# Patient Record
Sex: Male | Born: 1960 | Race: White | Marital: Single | Smoking: Current every day smoker
Health system: Southern US, Community
[De-identification: ages and names within clinical notes are randomized; demographics above are authoritative.]

## PROBLEM LIST (undated history)

## (undated) DIAGNOSIS — I219 Acute myocardial infarction, unspecified: Secondary | ICD-10-CM

## (undated) DIAGNOSIS — J42 Unspecified chronic bronchitis: Secondary | ICD-10-CM

## (undated) DIAGNOSIS — J189 Pneumonia, unspecified organism: Secondary | ICD-10-CM

## (undated) DIAGNOSIS — Z9981 Dependence on supplemental oxygen: Secondary | ICD-10-CM

## (undated) DIAGNOSIS — M109 Gout, unspecified: Secondary | ICD-10-CM

## (undated) DIAGNOSIS — B977 Papillomavirus as the cause of diseases classified elsewhere: Secondary | ICD-10-CM

## (undated) DIAGNOSIS — B1910 Unspecified viral hepatitis B without hepatic coma: Secondary | ICD-10-CM

## (undated) DIAGNOSIS — K219 Gastro-esophageal reflux disease without esophagitis: Secondary | ICD-10-CM

## (undated) DIAGNOSIS — M1990 Unspecified osteoarthritis, unspecified site: Secondary | ICD-10-CM

## (undated) DIAGNOSIS — H9190 Unspecified hearing loss, unspecified ear: Secondary | ICD-10-CM

## (undated) DIAGNOSIS — J45909 Unspecified asthma, uncomplicated: Secondary | ICD-10-CM

## (undated) DIAGNOSIS — B20 Human immunodeficiency virus [HIV] disease: Secondary | ICD-10-CM

## (undated) DIAGNOSIS — J449 Chronic obstructive pulmonary disease, unspecified: Secondary | ICD-10-CM

## (undated) DIAGNOSIS — K259 Gastric ulcer, unspecified as acute or chronic, without hemorrhage or perforation: Secondary | ICD-10-CM

## (undated) HISTORY — PX: EXCISIONAL HEMORRHOIDECTOMY: SHX1541

## (undated) HISTORY — PX: KNEE SURGERY: SHX244

---

## 2013-12-04 ENCOUNTER — Emergency Department (HOSPITAL_COMMUNITY): Payer: Medicare (Managed Care)

## 2013-12-04 ENCOUNTER — Encounter (HOSPITAL_COMMUNITY): Payer: Self-pay | Admitting: Emergency Medicine

## 2013-12-04 ENCOUNTER — Observation Stay (HOSPITAL_COMMUNITY)
Admission: EM | Admit: 2013-12-04 | Discharge: 2013-12-06 | Disposition: A | Discharge status: Home / Self Care | Payer: Medicare (Managed Care) | Attending: Family Medicine | Admitting: Family Medicine

## 2013-12-04 DIAGNOSIS — IMO0001 Reserved for inherently not codable concepts without codable children: Secondary | ICD-10-CM

## 2013-12-04 DIAGNOSIS — F17200 Nicotine dependence, unspecified, uncomplicated: Secondary | ICD-10-CM | POA: Insufficient documentation

## 2013-12-04 DIAGNOSIS — J441 Chronic obstructive pulmonary disease with (acute) exacerbation: Secondary | ICD-10-CM | POA: Insufficient documentation

## 2013-12-04 DIAGNOSIS — R0789 Other chest pain: Principal | ICD-10-CM

## 2013-12-04 DIAGNOSIS — I350 Nonrheumatic aortic (valve) stenosis: Secondary | ICD-10-CM

## 2013-12-04 DIAGNOSIS — Z5900 Homelessness unspecified: Secondary | ICD-10-CM

## 2013-12-04 DIAGNOSIS — R079 Chest pain, unspecified: Secondary | ICD-10-CM | POA: Insufficient documentation

## 2013-12-04 DIAGNOSIS — B977 Papillomavirus as the cause of diseases classified elsewhere: Secondary | ICD-10-CM

## 2013-12-04 DIAGNOSIS — Z9981 Dependence on supplemental oxygen: Secondary | ICD-10-CM | POA: Insufficient documentation

## 2013-12-04 DIAGNOSIS — B20 Human immunodeficiency virus [HIV] disease: Secondary | ICD-10-CM

## 2013-12-04 DIAGNOSIS — F411 Generalized anxiety disorder: Secondary | ICD-10-CM | POA: Insufficient documentation

## 2013-12-04 HISTORY — DX: Unspecified chronic bronchitis: J42

## 2013-12-04 HISTORY — DX: Acute myocardial infarction, unspecified: I21.9

## 2013-12-04 HISTORY — DX: Chronic obstructive pulmonary disease, unspecified: J44.9

## 2013-12-04 HISTORY — DX: Unspecified asthma, uncomplicated: J45.909

## 2013-12-04 HISTORY — DX: Gastric ulcer, unspecified as acute or chronic, without hemorrhage or perforation: K25.9

## 2013-12-04 HISTORY — DX: Unspecified hearing loss, unspecified ear: H91.90

## 2013-12-04 HISTORY — DX: Unspecified osteoarthritis, unspecified site: M19.90

## 2013-12-04 HISTORY — DX: Human immunodeficiency virus (HIV) disease: B20

## 2013-12-04 HISTORY — DX: Pneumonia, unspecified organism: J18.9

## 2013-12-04 HISTORY — DX: Gastro-esophageal reflux disease without esophagitis: K21.9

## 2013-12-04 HISTORY — DX: Unspecified viral hepatitis B without hepatic coma: B19.10

## 2013-12-04 HISTORY — DX: Dependence on supplemental oxygen: Z99.81

## 2013-12-04 HISTORY — DX: Gout, unspecified: M10.9

## 2013-12-04 HISTORY — DX: Papillomavirus as the cause of diseases classified elsewhere: B97.7

## 2013-12-04 LAB — CBC WITH DIFFERENTIAL/PLATELET
Basophils Absolute: 0 10*3/uL (ref 0.0–0.1)
Basophils Relative: 0 % (ref 0–1)
Eosinophils Absolute: 0.2 10*3/uL (ref 0.0–0.7)
Eosinophils Relative: 1 % (ref 0–5)
HCT: 44.6 % (ref 39.0–52.0)
HEMOGLOBIN: 14.3 g/dL (ref 13.0–17.0)
LYMPHS ABS: 2.5 10*3/uL (ref 0.7–4.0)
LYMPHS PCT: 22 % (ref 12–46)
MCH: 29.1 pg (ref 26.0–34.0)
MCHC: 32.1 g/dL (ref 30.0–36.0)
MCV: 90.7 fL (ref 78.0–100.0)
MONOS PCT: 8 % (ref 3–12)
Monocytes Absolute: 1 10*3/uL (ref 0.1–1.0)
NEUTROS ABS: 8 10*3/uL — AB (ref 1.7–7.7)
NEUTROS PCT: 69 % (ref 43–77)
PLATELETS: 158 10*3/uL (ref 150–400)
RBC: 4.92 MIL/uL (ref 4.22–5.81)
RDW: 13.8 % (ref 11.5–15.5)
WBC: 11.6 10*3/uL — AB (ref 4.0–10.5)

## 2013-12-04 LAB — I-STAT TROPONIN, ED
Troponin i, poc: 0.03 ng/mL (ref 0.00–0.08)
Troponin i, poc: 0.04 ng/mL (ref 0.00–0.08)

## 2013-12-04 LAB — BASIC METABOLIC PANEL
BUN: 8 mg/dL (ref 6–23)
CO2: 31 meq/L (ref 19–32)
Calcium: 8.9 mg/dL (ref 8.4–10.5)
Chloride: 101 mEq/L (ref 96–112)
Creatinine, Ser: 0.69 mg/dL (ref 0.50–1.35)
GFR calc Af Amer: >90 mL/min (ref >90)
GFR calc non Af Amer: >90 mL/min (ref >90)
GLUCOSE: 98 mg/dL (ref 70–99)
POTASSIUM: 4.5 meq/L (ref 3.7–5.3)
SODIUM: 143 meq/L (ref 137–147)

## 2013-12-04 LAB — TROPONIN I

## 2013-12-04 LAB — PRO B NATRIURETIC PEPTIDE: PRO B NATRI PEPTIDE: 337.4 pg/mL — AB (ref 0–125)

## 2013-12-04 MED ORDER — MORPHINE SULFATE 4 MG/ML IJ SOLN
4.0000 mg | Freq: Once | INTRAMUSCULAR | Status: DC
  Filled 2013-12-04: qty 1

## 2013-12-04 MED ORDER — ONDANSETRON HCL 4 MG/2ML IJ SOLN: 4.0000 mg | Freq: Four times a day (QID) | INTRAMUSCULAR | Status: DC | PRN

## 2013-12-04 MED ORDER — PREDNISONE 20 MG PO TABS
40.0000 mg | ORAL_TABLET | Freq: Every day | ORAL | Status: DC
  Administered 2013-12-05 – 2013-12-06 (×2): 40 mg via ORAL
  Filled 2013-12-04 (×3): qty 2

## 2013-12-04 MED ORDER — ISOSORBIDE MONONITRATE 15 MG HALF TABLET
15.0000 mg | ORAL_TABLET | Freq: Every day | ORAL | Status: DC
  Filled 2013-12-04 (×2): qty 1

## 2013-12-04 MED ORDER — ASPIRIN 81 MG PO CHEW
324.0000 mg | CHEWABLE_TABLET | Freq: Once | ORAL | Status: AC
  Administered 2013-12-04: 324 mg via ORAL
  Filled 2013-12-04: qty 4

## 2013-12-04 MED ORDER — LORAZEPAM 2 MG/ML IJ SOLN
1.0000 mg | Freq: Once | INTRAMUSCULAR | Status: AC
  Administered 2013-12-04: 1 mg via INTRAVENOUS
  Filled 2013-12-04: qty 1

## 2013-12-04 MED ORDER — IPRATROPIUM-ALBUTEROL 0.5-2.5 (3) MG/3ML IN SOLN
3.0000 mL | Freq: Once | RESPIRATORY_TRACT | Status: AC
  Administered 2013-12-04: 3 mL via RESPIRATORY_TRACT
  Filled 2013-12-04: qty 3

## 2013-12-04 MED ORDER — NITROGLYCERIN 0.4 MG SL SUBL: 0.4000 mg | SUBLINGUAL_TABLET | SUBLINGUAL | Status: DC | PRN

## 2013-12-04 MED ORDER — ENOXAPARIN SODIUM 40 MG/0.4ML ~~LOC~~ SOLN
40.0000 mg | SUBCUTANEOUS | Status: DC
  Filled 2013-12-04 (×4): qty 0.4

## 2013-12-04 MED ORDER — ACETAMINOPHEN 325 MG PO TABS: 650.0000 mg | ORAL_TABLET | ORAL | Status: DC | PRN

## 2013-12-04 MED ORDER — METOPROLOL TARTRATE 25 MG PO TABS
25.0000 mg | ORAL_TABLET | Freq: Two times a day (BID) | ORAL | Status: DC
  Filled 2013-12-04 (×3): qty 1

## 2013-12-04 MED ORDER — LORAZEPAM 0.5 MG PO TABS
0.5000 mg | ORAL_TABLET | Freq: Once | ORAL | Status: AC
  Administered 2013-12-04: 0.5 mg via ORAL
  Filled 2013-12-04: qty 1

## 2013-12-04 NOTE — Progress Notes (Signed)
RN told by translator that Pt wants IV morphine for chest pain 10/10. MD did not place orders for any IV pain medication due to patient history of IV drug use and belief that chest pain is r/t COPD/Bronchitis.RN offered patient nitroglycerin and tylenol, pt refused and stated he would like to go home if he cannot get IV morphine and anxiety medication. On call NP T. Claiborne BillingsCallahan made aware, order given for 1xdose of ativan.  Pt educated on the benefits of nitroglycerin but stated that he does not want it. PO ativan given 0.5mg  and patient content for now. Will continue to monitor patient.

## 2013-12-04 NOTE — Progress Notes (Signed)
[facility] History and Physical  Lawrence [name] [id number] DOB: August 05, 1960 DOA: 12/04/2013  Referring physician: ed PCP: No primary provider on file.  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 He states that he said is on an off for the past 2 days and that is ongoing in again radiation down his left arm. On deeper questioning he states that he has pain in his shoulder as well which causes left shoulder pain and arm pain He has no medications currently for his HIV or COPD. And hasn't taken these medications in about 2 months  he's had this chest pain in the past and has been admitted to the hospital and given medications.  He's been told that this is secondary to COPD He states that deep breathing usually makes it worse He states that he has had fever and cough as well as increased sweating and cramps in his abdomen has had over one to 2 weeks of diarrhea with no blood. He has no difficulty swallowing but he has had some weight loss    Review of Systems: See above  Past Medical History  Diagnosis Date  . AIDS   . COPD (chronic obstructive pulmonary disease)    History reviewed. No pertinent past surgical history. Social History:  History   Social History Narrative  . No narrative on file    Allergies  Allergen Reactions  . Bactrim [Sulfamethoxazole-Tmp Ds]     Fungus around his mouth  . Benadryl [Diphenhydramine Hcl (Sleep)] Itching    History reviewed. No pertinent family history.   Prior to Admission medications   Not on File   Physical Exam: Filed Vitals:   12/04/13 1345 12/04/13 1348 12/04/13 1507 12/04/13 1606  BP: 137/67   109/65  Pulse: 82   97  Temp:      TempSrc:      Resp: 39  17 21  SpO2: 95% 96% 97% 91%     General:  Alert pleasant  oriented  Eyes: EOMI NCAT  ENT: Moderate dentition no thrush no lymphadenopathy  Neck: Soft supple no thyromegaly  Cardiovascular: S1-S2 no murmur rub or gallop  Respiratory: Clinically clear  Abdomen: Soft nontender nondistended no rebound  Skin: No lower extremity edema  Musculoskeletal: Range of motion intact  Psychiatric: Euthymic  Neurologic: Neurologically grossly intact  Labs on Admission:  Basic Metabolic Panel:  Recent Labs Lab 12/04/13 1350  NA 143  K 4.5  CL 101  CO2 31  GLUCOSE 98  BUN 8  CREATININE 0.69  CALCIUM 8.9   Liver Function Tests: No results found for this basename: AST, ALT, ALKPHOS, BILITOT, PROT, ALBUMIN,  in the last 168 hours No results found for this basename: LIPASE, AMYLASE,  in the last 168 hours No results found for this basename: AMMONIA,  in the last 168 hours CBC:  Recent Labs Lab 12/04/13 1350  WBC 11.6*  NEUTROABS 8.0*  HGB 14.3  HCT 44.6  MCV 90.7  PLT 158   Cardiac Enzymes: No results found for this basename: CKTOTAL, CKMB, CKMBINDEX, TROPONINI,  in the last 168 hours  BNP (last 3 results) No results found for this basename: PROBNP,  in the last 8760 hours CBG: No results found for this basename: GLUCAP,  in the last 168 hours  Radiological  Exams on Admission: Dg Chest 2 View (if Patient Has Fever And/or Copd)  12/04/2013   CLINICAL DATA:  Cough and shortness of Breath  EXAM: CHEST  2 VIEW  COMPARISON:  None.  FINDINGS: The heart size and mediastinal contours are within normal limits. Both lungs are clear. The visualized skeletal structures are unremarkable.  IMPRESSION: No active cardiopulmonary disease.   Electronically Signed   By: Alcide CleverMark  Lukens M.D.   On: 12/04/2013 13:07    EKG: Independently reviewed. Sinus rhythm PR interval 0.12 QRS axis is 10, and anterior inversions in strange EKG and repeat order for  Assessment/Plan Principal Problem:   Chest pain radiating to arm-unclear etiology. Must rule out  ACS. Heart score reported to me as 5, in actuality when calculated at with 6 more 3. We'll get an echocardiogram cycle cardiac enzymes repeat EKG in the morning. He'll need to establish her primary care physician for further care. Start him on low-dose beta blocker 25 mg metoprolol twice a day. We will continue aspirin 81 mg daily for secondary prevention. Added Imdur to medications as well Active Problems:   AIDS-will need to establish care in this county and get care from our [facility] if patient wishes to get medications. 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 He is not really wheezing and does not have an acute exacerbation   Homeless-and get input from social work   No family at bedside Presumed focal Time spent: 5450  Mahala MenghiniSAMTANI, [facility] CenterJAI-GURMUKH [facility] Pager [phone]  If 7PM-7AM, please contact night-coverage [email or web address] Password [email or web address] 12/04/2013, 4:25 PM

## 2013-12-04 NOTE — ED Notes (Signed)
Pt is deaf. Per writing pt sts he is here for COPD and "full blown aids". Pt appears SOB. sts that he is having chest pain and SOB.

## 2013-12-04 NOTE — ED Notes (Signed)
Dr. Allen NorrisSantini told RN not to administer IV Morphine to the patient. Medication held.

## 2013-12-04 NOTE — ED Provider Notes (Signed)
CSN: [id number]     Arrival date & time 12/04/13  1124 History   First MD Initiated Contact with Patient 12/04/13 1235     Chief Complaint  Patient presents with  . COPD     (Consider location/radiation/quality/duration/timing/severity/associated sxs/prior Treatment) HPI Comments: Patient is a 53 yo M PMHx significant for COPD on 3L home oxygen, HTN, AIDS (last CD4 approx 100 2 months) presenting to the ED for two days of intermittent CP with shortness of breath and sputum production. Patient states two hours ago re-developed central chest pressure with radiation to left arm with associated shortness of breath. No history of cardiac catheterizations, echocardiogram, or stress tests. Mother died of "heart problems" at 5848.  The history is limited by a language barrier. A language interpreter was used Estate manager/land agent(American Sign Language).    Past Medical History  Diagnosis Date  . AIDS   . COPD (chronic obstructive pulmonary disease)    History reviewed. No pertinent past surgical history. History reviewed. No pertinent family history. History  Substance Use Topics  . Smoking status: Current Every Day Smoker  . Smokeless tobacco: Not on file  . Alcohol Use: No    Review of Systems  Respiratory: Positive for cough and shortness of breath.   Cardiovascular: Positive for chest pain.  Psychiatric/Behavioral: The patient is nervous/anxious.   All other systems reviewed and are negative.     Allergies  Bactrim and Benadryl  Home Medications   Prior to Admission medications   Not on File   BP 137/67  Pulse 82  Temp(Src) 98.2 F (36.8 C) (Oral)  Resp 17  SpO2 97% Physical Exam  Nursing note and vitals reviewed. Constitutional: He is oriented to person, place, and time. He appears well-developed and well-nourished. Nasal cannula in place.  HENT:  Head: Normocephalic and atraumatic.  Right Ear: External ear normal.  Left Ear: External ear normal.  Nose: Nose normal.  Mouth/Throat:  Oropharynx is clear and moist.  Eyes: Conjunctivae are normal.  Neck: Neck supple.  Cardiovascular: Normal rate, regular rhythm, normal heart sounds and intact distal pulses.   Pulmonary/Chest: No accessory muscle usage. Tachypnea noted. No respiratory distress. He has wheezes (mild expiratory wheeze). He has no rhonchi. He has no rales. He exhibits no tenderness.  Abdominal: Soft. There is no tenderness.  Musculoskeletal: Normal range of motion. He exhibits no edema.  Neurological: He is alert and oriented to person, place, and time.  Skin: Skin is warm and dry. He is not diaphoretic.    ED Course  Procedures (including critical care time) Medications  nitroGLYCERIN (NITROSTAT) SL tablet 0.4 mg (not administered)  morphine 4 MG/ML injection 4 mg (not administered)  ipratropium-albuterol (DUONEB) 0.5-2.5 (3) MG/3ML nebulizer solution 3 mL (3 mLs Nebulization Given 12/04/13 1149)  ipratropium-albuterol (DUONEB) 0.5-2.5 (3) MG/3ML nebulizer solution 3 mL (3 mLs Nebulization Given 12/04/13 1348)  LORazepam (ATIVAN) injection 1 mg (1 mg Intravenous Given 12/04/13 1359)  aspirin chewable tablet 324 mg (324 mg Oral Given 12/04/13 1359)    Labs Review Labs Reviewed  CBC WITH DIFFERENTIAL - Abnormal; Notable for the following:    WBC 11.6 (*)    Neutro Abs 8.0 (*)    All other components within normal limits  BASIC METABOLIC PANEL  T-HELPER CELLS (CD4) COUNT  PRO B NATRIURETIC PEPTIDE  I-STAT TROPOININ, ED  I-STAT TROPOININ, ED    Imaging Review Dg Chest 2 View (if Patient Has Fever And/or Copd)  12/04/2013   CLINICAL DATA:  Cough and shortness  of Breath  EXAM: CHEST  2 VIEW  COMPARISON:  None.  FINDINGS: The heart size and mediastinal contours are within normal limits. Both lungs are clear. The visualized skeletal structures are unremarkable.  IMPRESSION: No active cardiopulmonary disease.   Electronically Signed   By: Alcide CleverMark  Lukens M.D.   On: 12/04/2013 13:07     EKG  Interpretation   Date/Time:  Thursday December 04 2013 11:36:24 EDT Ventricular Rate:  87 PR Interval:  156 QRS Duration: 144 QT Interval:  432 QTC Calculation: 519 R Axis:   -67 Text Interpretation:  SR with short PR Non-specific intra-ventricular  conduction block Inferior Q waves--?Age Abnormal ECG Confirmed by [name]   MD, MARK ([id number]) on 12/04/2013 2:03:39 PM      MDM   Final diagnoses:  Chest pain radiating to arm    Filed Vitals:   12/04/13 1507  BP:   Pulse:   Temp:   Resp: 17   Afebrile, NAD, non-toxic appearing, AAOx4. I have reviewed nursing notes, vital signs, and all appropriate lab and imaging results for this patient.   Concern for cardiac etiology of Chest Pain. Hospitalist has been consulted and will see patient in the ED for likely admit. Pt does not meet criteria for CP protocol and a further evaluation is recommended. Pt has been re-evaluated prior to consult and VSS, NAD, heart RRR, pain 0/10, lungs CTAB. EKG with sinus rhythm shortened PR intervals with delta waves and inferior Q waves. No old EKG to compare. First round of cardiac enzymes negative. This case was discussed with Dr. [name] who has seen the patient and agrees with plan to admit.   Hospitalist requested repeat EKG, unchanged from arrival EKG.     Jeannetta EllisJennifer L Piepenbrink, PA-C 12/04/13 1642  Medical screening examination/treatment/procedure(s) were conducted as a shared visit with non-physician practitioner(s) and myself.  I personally evaluated the patient during the encounter.   EKG Interpretation   Date/Time:  Thursday December 04 2013 16:00:38 EDT Ventricular Rate:  102 PR Interval:  131 QRS Duration: 91 QT Interval:  312 QTC Calculation: 406 R Axis:   -76 Text Interpretation:  Sinus tachycardia Biatrial enlargement Right  ventricular hypertrophy Probable inferior infarct, old Repol abnrm, severe  global ischemia (LM/MVD) ED PHYSICIAN INTERPRETATION AVAILABLE IN [facility]  Confirmed by TEST, Record ([id number]) on 12/06/2013 8:45:39 AM        Rolland PorterMark James, MD 12/07/13 [phone]

## 2013-12-04 NOTE — ED Notes (Signed)
Ordered Heart Healthy tray 

## 2013-12-04 NOTE — ED Notes (Signed)
Attempted report 

## 2013-12-04 NOTE — Progress Notes (Signed)
Pt refused all his evening medications and wanted to leave AMA if he didn't get saltine crackers. RN called another unit to get patient saltine crackers, pt received saltine crackers and still refused medications. Pt educated on the importance of his medications. MD aware of patient's recent threats to leave AMA. Will continue to monitor patient.

## 2013-12-04 NOTE — ED Notes (Signed)
Called for Sign Language Interpreter 

## 2013-12-05 DIAGNOSIS — R079 Chest pain, unspecified: Secondary | ICD-10-CM

## 2013-12-05 DIAGNOSIS — I359 Nonrheumatic aortic valve disorder, unspecified: Secondary | ICD-10-CM

## 2013-12-05 DIAGNOSIS — J449 Chronic obstructive pulmonary disease, unspecified: Secondary | ICD-10-CM

## 2013-12-05 LAB — T-HELPER CELLS (CD4) COUNT (NOT AT ARMC)
CD4 % Helper T Cell: 9 % — ABNORMAL LOW (ref 33–55)
CD4 T CELL ABS: 230 /uL — AB (ref 400–2700)

## 2013-12-05 MED ORDER — ALBUTEROL SULFATE HFA 108 (90 BASE) MCG/ACT IN AERS: 2.0000 | INHALATION_SPRAY | RESPIRATORY_TRACT | Status: DC | PRN

## 2013-12-05 MED ORDER — ALBUTEROL SULFATE (2.5 MG/3ML) 0.083% IN NEBU: 2.5000 mg | INHALATION_SOLUTION | RESPIRATORY_TRACT | Status: DC | PRN

## 2013-12-05 MED ORDER — METOPROLOL TARTRATE 12.5 MG HALF TABLET
12.5000 mg | ORAL_TABLET | Freq: Two times a day (BID) | ORAL | Status: DC
  Administered 2013-12-05 – 2013-12-06 (×2): 12.5 mg via ORAL
  Filled 2013-12-05 (×4): qty 1

## 2013-12-05 MED ORDER — BUSPIRONE HCL 10 MG PO TABS
10.0000 mg | ORAL_TABLET | Freq: Three times a day (TID) | ORAL | Status: DC
  Administered 2013-12-05 – 2013-12-06 (×5): 10 mg via ORAL
  Filled 2013-12-05 (×6): qty 1

## 2013-12-05 MED ORDER — ASPIRIN EC 81 MG PO TBEC
81.0000 mg | DELAYED_RELEASE_TABLET | Freq: Every day | ORAL | Status: DC
Start: 2013-12-05 — End: 2013-12-06
  Administered 2013-12-05 – 2013-12-06 (×2): 81 mg via ORAL
  Filled 2013-12-05 (×2): qty 1

## 2013-12-05 NOTE — Progress Notes (Signed)
Utilization review completed.  

## 2013-12-05 NOTE — Progress Notes (Addendum)
[facility] at 1:30pm at [address] Phone [phone] with Tomasita Morrowammy King to get established for HIV care. Briefly spoke with him. [facility]: [phone].  Previously taking: triomeq, rilpivirine, dapsone 25mg  daily.   Please give him this info on his discharge summary

## 2013-12-05 NOTE — Progress Notes (Signed)
  Echocardiogram 2D Echocardiogram has been performed.  [name], Lawrence FRANCES 12/05/2013, 10:58 AM

## 2013-12-05 NOTE — Progress Notes (Signed)
Lab has attempted to draw patient's troponin level twice and serum creatinine level, patient refused. NP T. Claiborne BillingsCallahan made aware. Will continue to monitor patient.

## 2013-12-05 NOTE — Consult Note (Addendum)
CARDIOLOGY CONSULT NOTE  Patient ID: Lawrence [name] MRN: [id number] DOB/AGE: 01/23/1961 53 y.o.  Admit date: 12/04/2013 Primary Cardiologist: New Reason for Consultation: Chest pain  HPI: 53 yo deaf patient with HIV and COPD presented to the ER with chest pain yesterday.  He was interviewed with a sign language interpreter.  He is homeless and moved here recently from [place].  He is not on HIV meds. He has a history of chest pain with evaluations in the past.  He says that he had a cardiac catheterization in the past and was told that everything was ok.  This time, he developed chest pain yesterday morning.  It has been more or less continuous since that time with some radiation down his left arm.  He had 3 sets of troponin drawn yesterday, all negative.  ECG shows NSR with RVH.  He is an active smoker.  The chest pain does have a pleuritic component to it (worse with cough and deep breathing).    Review of systems complete and found to be negative unless listed above in HPI  Past Medical History: 1. HIV: Not on meds currently. 2. COPD: Active smoker 3. Chest pain: had cardiac cath in the past, says that it was normal 4. Deaf 5. HPV  FH: No premature CAD  History   Social History  . Marital Status: Single    Spouse Name: N/A    Number of Children: N/A  . Years of Education: N/A   Occupational History  . Not on file.   Social History Main Topics  . Smoking status: Current Every Day Smoker -- 0.12 packs/day for 36 years    Types: Cigarettes  . Smokeless tobacco: Never Used  . Alcohol Use: No  . Drug Use: No  . Sexual Activity: No   Other Topics Concern  . Not on file   Social History Narrative  . Homeless    Current Facility-Administered Medications  Medication Dose Route Frequency Provider Last Rate Last Dose  . acetaminophen (TYLENOL) tablet 650 mg  650 mg Oral Q4H PRN Rhetta MuraJai-Gurmukh Samtani, MD      . enoxaparin (LOVENOX) injection 40 mg  40 mg Subcutaneous Q24H  Rhetta MuraJai-Gurmukh Samtani, MD      . isosorbide mononitrate (IMDUR) 24 hr tablet 15 mg  15 mg Oral Daily Rhetta MuraJai-Gurmukh Samtani, MD      . metoprolol tartrate (LOPRESSOR) tablet 25 mg  25 mg Oral BID Rhetta MuraJai-Gurmukh Samtani, MD      . nitroGLYCERIN (NITROSTAT) SL tablet 0.4 mg  0.4 mg Sublingual Q5 Min x 3 PRN Jennifer L Piepenbrink, PA-C      . ondansetron (ZOFRAN) injection 4 mg  4 mg Intravenous Q6H PRN Rhetta MuraJai-Gurmukh Samtani, MD      . predniSONE (DELTASONE) tablet 40 mg  40 mg Oral QAC breakfast Rhetta MuraJai-Gurmukh Samtani, MD         Physical exam Blood pressure 96/60, pulse 72, temperature 98.2 F (36.8 C), temperature source Oral, resp. rate 20, height 5\' 7"  (1.702 m), weight 150 lb (68.04 kg), SpO2 96.00%. General: NAD Neck: No JVD, no thyromegaly or thyroid nodule.  Lungs: Rhonchi, distant breath sounds, prolonged expiratory phase.  CV: Nondisplaced PMI.  Heart regular S1/S2, no S3/S4, 2/6 SEM RUSB.  No peripheral edema.  No carotid bruit.  Normal pedal pulses.  Abdomen: Soft, nontender, no hepatosplenomegaly, no distention.  Skin: Intact without lesions or rashes.  Neurologic: Alert and oriented x 3.  Psych: Normal affect. Extremities: No clubbing or cyanosis.  HEENT: Normal.   Labs:   Lab Results  Component Value Date   WBC 11.6* 12/04/2013   HGB 14.3 12/04/2013   HCT 44.6 12/04/2013   MCV 90.7 12/04/2013   PLT 158 12/04/2013    Recent Labs Lab 12/04/13 1350  NA 143  K 4.5  CL 101  CO2 31  BUN 8  CREATININE 0.69  CALCIUM 8.9  GLUCOSE 98   Lab Results  Component Value Date   TROPONINI <0.30 12/04/2013     Radiology: - CXR: No acute findings  EKG: NSR, RVH, narrow inferior Qs of questionable significance  ASSESSMENT AND PLAN: 53 yo deaf patient with HIV and COPD presented to the ER with chest pain yesterday.   1. Chest pain: Prior evaluations apparently negative.  He has had prolonged chest pain with a pleuritic component, cardiac enzymes negative.  Still some mild chest pain this  morning.   - I will arrange for a stress echo after severe AS is ruled out by a full 2D echo.  2. Murmur: Patient has an aortic-area systolic murmur. Possible AS, does not sound severe.  Will get full echo prior to stress echo to rule out severe AS.  3. COPD:  Patient is an active smoker, needs to quit.  ECG shows RVH, suspect he has quite significant COPD.   Marca AnconaDalton Ressie Slevin 12/05/2013  Patient refuses to exercise, will try to arrange Lexiscan Cardiolite.   Marca AnconaDalton Tish Begin 12/05/2013 10:00 AM

## 2013-12-05 NOTE — Progress Notes (Signed)
I received a call from the stress echo tech that the patient did not want to proceed with stress echo unless he was able to take SL NTG beforehand. BP was soft at 109/42, earlier was in the 90s systolic. Echo did show some AS but not severe enough to prohibit exercise per Dr. Shirlee LatchMcLean. I did not feel comfortable giving the patient SL NTG pre-test given soft BP and AS due to risk of clinical deterioration. This was explained to the patient via sign-language interpreter. We [facility] to [facility] instead, but he decided to try stress echo without NTG. I also confirmed with MD that we wanted to exercise the patient given COPD, ongoing CP, and possible delta waves on EKG. This was affirmed and I went down to proctor the test. Interpreter was present for the entire duration. The test protocol was explained to him in depth and we established an easy hand signal (one hand up) for him to use if he wanted to stop the test immediately. He asked if signing the consent meant that he couldn't sue us if something happened to him. I reiterated the indications/reasoning for the test, but also stated in several different ways that proceeding with the test should be his decision, that he is not being coerced into doing so, and that he has full right to refuse if he does not feel comfortable. He was aware that we could not perform the test if we did not have his permission to do so. He confirmed understanding of this and decided to consent. When the patient indicated he was ready, we began the test at stage 1 per protocol. Almost immediately after movement of the treadmill belt, the patient signaled to stop, citing that the treadmill was too fast. The treadmill was immediately stopped. The patient stopped walking in place and rode the slowed belt to the end of the machine, where we assisted him off without incident or injury. Vitals were stable and the patient denied acute complaint. Discussed with Dr. Shirlee LatchMcLean.  Will proceed with Lexiscan nuclear stress test in AM as the patient had something to eat/drink earlier (not having AECOPD). We considered cardiac CT but unfortunately the machine is down at this time. Discussed plan with patient who is in agreement. Dayna Dunn PA-C

## 2013-12-05 NOTE — Progress Notes (Signed)
  Echocardiogram Echocardiogram Stress Test has been attempted. Patient unable to walk on treadmill.   Lawrence [name], Lawrence [name] 12/05/2013, 10:59 AM

## 2013-12-05 NOTE — Progress Notes (Signed)
Note: This document was prepared with digital dictation and possible smart phrase technology. Any transcriptional errors that result from this process are unintentional.   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 He states that he said is on an off for the past 2 days and that is ongoing in again radiation down his left arm. On deeper questioning he states that he has pain in his shoulder as well which causes left shoulder pain and arm pain  He has no medications currently for his HIV or COPD.  And hasn't taken these medications in about 2 months   Past medical history-As per Problem list Chart reviewed as below- reviewed  Consultants:   Cardiolog  Procedures:  Echo  cxr    Antibiotics:  none   Subjective  Doing fiar. Refused multiple tests Anxious o/n tol po No n/v/diarrhea   Objective    Interim History:   Telemetry: sinus   Objective: Filed Vitals:   12/04/13 2048 12/04/13 2135 12/05/13 0516 12/05/13 1116  BP: 109/50 113/89 96/60 112/60  Pulse: 83 90 72   Temp: 97.5 F (36.4 C)  98.2 F (36.8 C)   TempSrc: Oral  Oral   Resp: 22  20   Height:      Weight:      SpO2: 97%  96%    No intake or output data in the 24 hours ending 12/05/13 1132  Exam:  General: eomi, ncat Cardiovascular: s1 s 2no m/r/g Respiratory: clear, no added sound Abdomen: soft, NT, Nd Skinno le edema  Neuro intact  Data Reviewed: Basic Metabolic Panel:  Recent Labs Lab 12/04/13 1350  NA 143  K 4.5  CL 101  CO2 31  GLUCOSE 98  BUN 8  CREATININE 0.69  CALCIUM 8.9   Liver Function Tests: No results found for this basename: AST, ALT, ALKPHOS, BILITOT, PROT, ALBUMIN,  in the last 168 hours No  results found for this basename: LIPASE, AMYLASE,  in the last 168 hours No results found for this basename: AMMONIA,  in the last 168 hours CBC:  Recent Labs Lab 12/04/13 1350  WBC 11.6*  NEUTROABS 8.0*  HGB 14.3  HCT 44.6  MCV 90.7  PLT 158   Cardiac Enzymes:  Recent Labs Lab 12/04/13 1945  TROPONINI <0.30   BNP: No components found with this basename: POCBNP,  CBG: No results found for this basename: GLUCAP,  in the last 168 hours  No results found for this or any previous visit (from the past 240 hour(s)).   Studies:              All Imaging reviewed and is as per above notation   Scheduled Meds: . aspirin EC  81 mg Oral Daily  . busPIRone  10 mg Oral TID  . enoxaparin (LOVENOX) injection  40 mg Subcutaneous Q24H  . isosorbide mononitrate  15 mg Oral Daily  . metoprolol tartrate  25 mg Oral BID  . predniSONE  40 mg Oral QAC breakfast   Continuous Infusions:    Assessment/Plan: 1. ROMI-Lexiscan scheduled for am.  Troponin's neg.  As Blood pressure marginal cut back dose Metoprolol 25-12.5 bid.  D/c imdur 2. Shoulder pain-OP follow up 3. COPD-stable-desat screen ordered.  As  has mild wheeze, start albuterol q 4 prn.  No acute role for iv steroids continue prednisone 40 daily 4. AId/HIV-needs OP initiation and regular f/u-this has been explained multiple x's to him 5. Bipolar-started buspar 10 tid   Code Status: full Family Communication:  None + Disposition Plan: inpatient   Pleas KochJai Samtani, MD  [facility] Pager [phone] 12/05/2013, 11:32 AM    LOS: 1 day

## 2013-12-05 NOTE — Progress Notes (Signed)
Notified Dr. Mahala MenghiniSamtani that pt refused his metoprolol and imdur because he felt like his blood pressure was too low for those medications. Orders changes

## 2013-12-05 NOTE — Progress Notes (Signed)
Nutrition Brief Note  Patient identified on the Malnutrition Screening Tool (MST) Report  Patient is deaf with HIV. Reports about 10 pound weight loss in 6 months. However, he is eating very well currently. He is homeless so weight loss likely related to lack of resources.   Wt Readings from Last 15 Encounters:  12/04/13 150 lb (68.04 kg)    Body mass index is 23.49 kg/(m^2). Patient meets criteria for normal weight based on current BMI.   Current diet order is Heart Healthy, patient is consuming approximately 100% of meals at this time. Labs and medications reviewed.   No nutrition interventions warranted at this time. If nutrition issues arise, please consult RD.   Linnell FullingElyse Shearer, RD, LDN Pager #: [phone] After-Hours Pager #: [phone]

## 2013-12-06 ENCOUNTER — Observation Stay (HOSPITAL_COMMUNITY): Payer: Medicare (Managed Care)

## 2013-12-06 DIAGNOSIS — I359 Nonrheumatic aortic valve disorder, unspecified: Secondary | ICD-10-CM

## 2013-12-06 LAB — BASIC METABOLIC PANEL
BUN: 6 mg/dL (ref 6–23)
BUN: 6 mg/dL (ref 6–23)
CALCIUM: 8.9 mg/dL (ref 8.4–10.5)
CHLORIDE: 101 meq/L (ref 96–112)
CO2: 34 mEq/L — ABNORMAL HIGH (ref 19–32)
CO2: 33 meq/L — AB (ref 19–32)
Calcium: 8.7 mg/dL (ref 8.4–10.5)
Chloride: 102 mEq/L (ref 96–112)
Creatinine, Ser: 0.72 mg/dL (ref 0.50–1.35)
Creatinine, Ser: 0.65 mg/dL (ref 0.50–1.35)
GFR calc Af Amer: >90 mL/min (ref >90)
GFR calc Af Amer: >90 mL/min (ref >90)
GFR calc non Af Amer: >90 mL/min (ref >90)
GLUCOSE: 106 mg/dL — AB (ref 70–99)
GLUCOSE: 80 mg/dL (ref 70–99)
Potassium: 5.5 mEq/L — ABNORMAL HIGH (ref 3.7–5.3)
Potassium: 5 mEq/L (ref 3.7–5.3)
Sodium: 141 mEq/L (ref 137–147)
Sodium: 142 mEq/L (ref 137–147)

## 2013-12-06 MED ORDER — TECHNETIUM TC 99M SESTAMIBI - CARDIOLITE
30.0000 | Freq: Once | INTRAVENOUS | Status: AC | PRN
  Administered 2013-12-06: 30 via INTRAVENOUS

## 2013-12-06 MED ORDER — REGADENOSON 0.4 MG/5ML IV SOLN: 0.4000 mg | Freq: Once | INTRAVENOUS | Status: AC

## 2013-12-06 MED ORDER — METOPROLOL TARTRATE 12.5 MG HALF TABLET: 12.5000 mg | ORAL_TABLET | Freq: Two times a day (BID) | ORAL | Status: DC

## 2013-12-06 MED ORDER — PENTAMIDINE ISETHIONATE 300 MG IN SOLR
300.0000 mg | Freq: Once | RESPIRATORY_TRACT | Status: DC
  Filled 2013-12-06: qty 300

## 2013-12-06 MED ORDER — SODIUM CHLORIDE 0.9 % IJ SOLN
80.0000 mg | INTRAVENOUS | Status: AC
  Administered 2013-12-06: 80 mg via INTRAVENOUS

## 2013-12-06 MED ORDER — BUSPIRONE HCL 10 MG PO TABS: 10.0000 mg | ORAL_TABLET | Freq: Three times a day (TID) | ORAL | Status: DC

## 2013-12-06 MED ORDER — TECHNETIUM TC 99M SESTAMIBI GENERIC - CARDIOLITE
10.0000 | Freq: Once | INTRAVENOUS | Status: AC | PRN
  Administered 2013-12-06: 10 via INTRAVENOUS

## 2013-12-06 MED ORDER — REGADENOSON 0.4 MG/5ML IV SOLN
INTRAVENOUS | Status: AC
  Administered 2013-12-06: 0.4 mg
  Filled 2013-12-06: qty 5

## 2013-12-06 NOTE — Progress Notes (Signed)
Interpreter line called at [phone] (Access Code [phone]) to ask for Sign Language interpreter.  Agent stated that they only have a Spanish in-house interpreter, but to call these 2 numbers:  [phone] OR [phone] OR email: [email or web address].  Voicemail was left at first number, email was also sent.    I also spoke to SW at [phone] who said she was not covering 3W today but would contact our SW and let her know then would call back.    Dr. Mahala MenghiniSamtani notified.

## 2013-12-06 NOTE — Discharge Summary (Signed)
Physician Discharge Summary  Lawrence PicaCurtis Pless ZOX:[id number] DOB: 08/28/1960 DOA: 12/04/2013  PCP: No PCP Per Patient  Admit date: 12/04/2013 Discharge date: 12/06/2013  Time spent: 25 minutes  Recommendations for Outpatient Follow-up:  1. Needs to follow up at [facility] for HIV HAART re-initiation 2. Continue BB 3. 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 He states that he said is on an off for the past 2 days and that is ongoing in again radiation down his left arm. On deeper questioning he states that he has pain in his shoulder as well which causes left shoulder pain and arm pain  He has no medications currently for his HIV or COPD.  And hasn't taken these medications in about 2 months   Hospital Course:   1. ROMI-Lexiscan 6/27 neg for new issue however showed fixed deficit. Troponin's neg. As Blood pressure marginal cut back dose Metoprolol 25-12.5 bid. D/c imdur 2. Shoulder pain-OP follow up 3. COPD-stable-desat screen ordered. As has mild wheeze, start albuterol q 4 prn. D/c prednisone on d/c 4. AIDS-needs OP initiation and regular f/u-this has been explained multiple x's to him-I have in addition given him [facility] contact info in case he decided he will stay in [place] to seek OP management of this.  I discussed his D/c with Dr. Orvan Falconerampbell 5. Bipolar-started buspar 10  tid   Consultations:  Cardiology  Discharge Exam: Filed Vitals:   12/06/13 1333  BP: 118/71  Pulse: 69  Temp: 98.3 F (36.8 C)  Resp: 18    General: eomi, ncat Cardiovascular: s1s2 Respiratory: clear  Discharge Instructions You were cared for by a hospitalist during your hospital stay. If you have any questions about your discharge medications or the care you received while you were in the hospital after you are discharged, you can call the unit and asked to speak with the hospitalist on call if the hospitalist that took care of you is not available. Once you are discharged, your primary care physician will handle any further medical issues. Please note that NO REFILLS for any discharge medications will be authorized once you are discharged, as it is imperative that you return to your primary care physician (or establish a relationship with a primary care physician if you do not have one) for your aftercare needs so that they can reassess your need for medications and monitor your lab values.  Discharge Instructions   Diet - low sodium heart healthy    Complete by:  As directed      Discharge instructions    Complete by:  As directed   Find a physician Get your prescriptions filled for metoprolol, a blood pressure medicine And Buspar an anxiety medicine [facility]. I have given you their info-You will need to get in touch with  them     Increase activity slowly    Complete by:  As directed             Medication List         busPIRone 10 MG tablet  Commonly known as:  BUSPAR  Take 1 tablet (10 mg total) by mouth 3 (three) times daily.     metoprolol tartrate 12.5 mg Tabs tablet  Commonly known as:  LOPRESSOR  Take 0.5 tablets (12.5 mg total) by mouth 2 (two) times daily.       Allergies  Allergen Reactions  . Bactrim [Sulfamethoxazole-Tmp Ds]     Fungus around his mouth  . Benadryl [Diphenhydramine Hcl (Sleep)]  Itching       Follow-up Information   Schedule an appointment as soon as possible for a visit with Judyann Munson, MD. (call them to follow up)    Specialty:  Infectious Diseases   Contact information:   [address] [place] [address] [phone]        The results of significant diagnostics from this hospitalization (including imaging, microbiology, ancillary and laboratory) are listed below for reference.    Significant Diagnostic Studies: Dg Chest 2 View (if Patient Has Fever And/or Copd)  12/04/2013   CLINICAL DATA:  Cough and shortness of Breath  EXAM: CHEST  2 VIEW  COMPARISON:  None.  FINDINGS: The heart size and mediastinal contours are within normal limits. Both lungs are clear. The visualized skeletal structures are unremarkable.  IMPRESSION: No active cardiopulmonary disease.   Electronically Signed   By: Alcide Clever M.D.   On: 12/04/2013 13:07   Nm Myocar Multi W/spect W/wall Motion / Ef  12/06/2013   CLINICAL DATA:  Chest pain  EXAM: MYOCARDIAL IMAGING WITH SPECT (REST AND PHARMACOLOGIC-STRESS)  GATED LEFT VENTRICULAR WALL MOTION STUDY  LEFT VENTRICULAR EJECTION FRACTION  TECHNIQUE: Standard myocardial SPECT imaging was performed after resting intravenous injection of 10 mCi Tc-37m sestamibi. Subsequently, intravenous infusion of Lexiscan was performed under the supervision of the Cardiology staff. At peak effect of the drug, 30 mCi Tc-56m sestamibi was injected intravenously and standard myocardial SPECT imaging was performed. Quantitative gated imaging was also performed to evaluate left ventricular wall motion, and estimate left ventricular ejection fraction.  COMPARISON:  None.  FINDINGS: SPECT: Large fixed defect involving the apex and inferior wall. No definite stress-induced ischemia.  Wall motion:  Normal motion  Ejection fraction: 53%. End-diastolic volume 105 cc. End systolic volume 50 cc.  IMPRESSION: Fixed defect in the inferior wall and apex. No  evidence of stress-induced ischemia   Electronically Signed   By: Maryclare Bean M.D.   On: 12/06/2013 14:11    Microbiology: No results found for this or any previous visit (from the past 240 hour(s)).   Labs: Basic Metabolic Panel:  Recent Labs Lab 12/04/13 1350 12/06/13 0425 12/06/13 0830  NA 143 141 142  K 4.5 5.5* 5.0  CL 101 101 102  CO2 31 34* 33*  GLUCOSE 98 106* 80  BUN 8 6 6   CREATININE 0.69 0.72 0.65  CALCIUM 8.9 8.9 8.7   Liver Function Tests: No results found for this basename: AST, ALT, ALKPHOS, BILITOT, PROT, ALBUMIN,  in the last 168 hours No results found for this basename: LIPASE, AMYLASE,  in the last 168 hours No results found for this basename: AMMONIA,  in the last 168 hours CBC:  Recent Labs Lab 12/04/13 1350  WBC 11.6*  NEUTROABS 8.0*  HGB  14.3  HCT 44.6  MCV 90.7  PLT 158   Cardiac Enzymes:  Recent Labs Lab 12/04/13 1945  TROPONINI <0.30   BNP: BNP (last 3 results)  Recent Labs  12/04/13 1623  PROBNP 337.4*   CBG: No results found for this basename: GLUCAP,  in the last 168 hours     Signed:  Rhetta MuraSAMTANI, JAI-GURMUKH  [facility] 12/06/2013, 4:07 PM

## 2013-12-06 NOTE — Progress Notes (Addendum)
Clinical Child psychotherapistocial Worker (CSW) received call from RN requesting a sign language interpreter because patient is being D/C'ed today. CSW contacted [employer] at [phone] and spoke with Darel HongJudy who reported that she would try to locate a sign language interpreter however they are all at a conference this weekend. Darel HongJudy reported that she would call CSW when she locates someone. CSW also called [employer] at [phone] and did not get an answer. CSW will continue to follow and assist as needed.   Per Darel HongJudy a sign language interpreter can get to the hospital at 5 pm. Per RN an interpreter is no longer needed. CSW contacted RN case manager for medication assistance.    Jetta LoutBailey Morgan, LCSWA Weekend CSW [phone]

## 2013-12-06 NOTE — Clinical Social Work Note (Signed)
CSW made aware of patient d/c and need for assistance with transportation. CSW informed patient resides [facility] [place]. CSW contacted [facility] [phone]) and spoke with Carren Rangurtis Vieno who reported patient may return as long as he has d/c instructions confirming he has been hospitalized. CSW provided tax voucher and contacted [facility]. Patient to be transported to [facility] via [facility]. RN made aware. No further needs. CSW signing off.  Crystal Patrick-Jefferson, LCSWA Weekend Clinical Social Worker [phone]

## 2013-12-06 NOTE — Progress Notes (Signed)
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  For MV this AM.  CE negative.  CURRENT MEDS . aspirin EC  81 mg Oral Daily  . busPIRone  10 mg Oral TID  . enoxaparin (LOVENOX) injection  40 mg Subcutaneous Q24H  . metoprolol tartrate  12.5 mg Oral BID  . predniSONE  40 mg Oral QAC breakfast    OBJECTIVE  Filed Vitals:   12/05/13 1347 12/05/13 2100 12/05/13 2239 12/06/13 0500  BP: 131/67 115/73 117/67 100/54  Pulse: 92 94 92 78  Temp: 97.4 F (36.3 C) 97.5 F (36.4 C)  97.6 F (36.4 C)  TempSrc: Oral     Resp: 19 18  18   Height:      Weight:    151 lb 3.2 oz (68.584 kg)  SpO2: 90% 97%  96%    Intake/Output Summary (Last 24 hours) at 12/06/13 0922 Last data filed at 12/06/13 0300  Gross per 24 hour  Intake    600 ml  Output    200 ml  Net    400 ml   Filed Weights   12/04/13 1755 12/06/13 0500  Weight: 150 lb (68.04 kg) 151 lb 3.2 oz (68.584 kg)    PHYSICAL EXAM  General: Pleasant, NAD. Neuro: Alert and oriented X 3. Moves all extremities spontaneously. Psych: Normal affect. HEENT:  Deaf, otw nl.  Neck: Supple without bruits or JVD. Lungs:  Resp regular and unlabored, diffuse insp/exp wheezing and rhonchi. Heart: RRR no s3, s4, or 2/6 sem rusb. Abdomen: Soft, non-tender, non-distended, BS + x 4.  Extremities: No clubbing, cyanosis or edema. DP/PT/Radials 2+ and equal bilaterally.  Accessory Clinical Findings  CBC  Recent Labs  12/04/13 1350  WBC 11.6*  NEUTROABS 8.0*  HGB 14.3  HCT 44.6  MCV 90.7  PLT 158   Basic Metabolic Panel  Recent Labs  12/06/13 0425 12/06/13 0830  NA 141 142  K 5.5* 5.0  CL 101 102  CO2 34* 33*  GLUCOSE 106* 80  BUN 6 6  CREATININE 0.72 0.65  CALCIUM 8.9 8.7   Cardiac  Enzymes  Recent Labs  12/04/13 1945  TROPONINI <0.30   TELE  Seen in nuc med.  Radiology/Studies  Dg Chest 2 View (if Patient Has Fever And/or Copd)  12/04/2013   CLINICAL DATA:  Cough and shortness of Breath  EXAM: CHEST  2 VIEW  COMPARISON:  None.  FINDINGS: The heart size and mediastinal contours are within normal limits. Both lungs are clear. The visualized skeletal structures are unremarkable.  IMPRESSION: No active cardiopulmonary disease.   Electronically Signed   By: Alcide CleverMark  Lukens M.D.   On: 12/04/2013 13:07   2D Echocardiogram 6.26.2015  Study Conclusions  - Left ventricle: The cavity size was normal. Wall thickness was increased in a pattern of mild LVH. Systolic function was vigorous. The estimated ejection fraction was in the range of 65% to 70%. Wall motion was normal; there were no regional wall motion abnormalities. Doppler parameters are consistent with abnormal left ventricular relaxation (grade 1 diastolic dysfunction). - Aortic valve: Trileaflet; moderately calcified leaflets. There was moderate stenosis. Mean gradient (S): 24 mm Hg. Peak gradient (S): 34 mm Hg. - Mitral valve: Chordal calcification noted. The valve opens well  but mean gradient is increased to 6 mmHg. MVA by PHT is normal. I think that the gradient is likely elevated due to high flow rather than mitral stenosis. There was no significant regurgitation. Mean gradient (D): 6 mm Hg. Valve area by pressure half-time: 2.34 cm^2. - Right ventricle: The cavity size was normal. Systolic function was normal. - Tricuspid valve: Peak RV-RA gradient (S): 27 mm Hg. - Pulmonary arteries: PA peak pressure: 30 mm Hg (S). - Inferior vena cava: The vessel was normal in size. The respirophasic diameter changes were in the normal range (>= 50%), consistent with normal central venous pressure. _____________   ASSESSMENT AND PLAN  1.  Chest Pain:  CE neg.  MV today.  2.  Systolic murmur:  Echo yesterday -  nl EF, moderate AS.  3.  HIV:  No meds, per IM.  4.  COPD/tob abuse:  Per IM.  He tolerated lexiscan well, though we did reverse him with aminophylline given wheezing.  Signed, Nicolasa Duckinghristopher Berge NP  Patient seen and examined. Agree with assessment and plan.Back from nuclear study. Await final results. Currently no chest pain. AS murmur. No CHF on exam.   Lennette Biharihomas A. Kelly, MD, [facility] 12/06/2013 12:03 PM

## 2013-12-06 NOTE — Care Management Note (Signed)
    Page 1 of 1   12/06/2013     4:50:30 PM CARE MANAGEMENT NOTE 12/06/2013  Patient:  Lawrence [name],Lawrence [name]   Account Number:  [id number]  Date Initiated:  12/06/2013  Documentation initiated by:  [facility] LLCJEFFRIES,SARAH  Subjective/Objective Assessment:   adm: Chest pain radiating to arm     Action/Plan:   discharge planning   Anticipated DC Date:  12/06/2013   Anticipated DC Plan:  HOME/SELF CARE      DC Planning Services  CM consult      Choice offered to / List presented to:             Status of service:  Completed, signed off Medicare Important Message given?   (If response is "NO", the following Medicare IM given date fields will be blank) Date Medicare IM given:   Date Additional Medicare IM given:    Discharge Disposition:  HOME/SELF CARE  Per UR Regulation:    If discussed at Long Length of Stay Meetings, dates discussed:    Comments:  12/06/13 16:45 CSW called CM requesting med asst.  Pt has Medicare A&B and therefore not eligible for MATCH.  [facility]  CM brought PCP list and communicated through wiriting (pt deaf) the importance of securing a PCP.  Pt given list and states Lawrence [name] will take care of this issue.  CSW arranging transportation home.  No other CM needs were communicated.  Freddy JakschSarah Jeffries, BSN, CM [phone].

## 2013-12-07 NOTE — ED Provider Notes (Signed)
Pt seen and examined via interpreter.  /Complains of pain and dyspnea.  Clear lungs on exam.  I have reviewed PAs note above.  I agree with evaluation and admission.  Rolland PorterMark James, MD 12/07/13 [phone]

## 2013-12-08 ENCOUNTER — Ambulatory Visit: Payer: Medicare Other

## 2013-12-08 LAB — HIV-1 RNA ULTRAQUANT REFLEX TO GENTYP+
HIV 1 RNA QUANT: 277581 {copies}/mL — AB (ref <20)
HIV-1 RNA QUANT, LOG: 5.44 {Log} — AB (ref <1.30)

## 2013-12-09 LAB — HLA B*5701: HLA B 5701: NEGATIVE

## 2013-12-11 ENCOUNTER — Telehealth: Payer: Self-pay

## 2013-12-11 DIAGNOSIS — H9190 Unspecified hearing loss, unspecified ear: Secondary | ICD-10-CM | POA: Insufficient documentation

## 2013-12-11 NOTE — Telephone Encounter (Signed)
Referral from [facility] . Case Manager suggested I call [facility] and speak with Will since patient is homeless.  Patient is deaf.    Patient contacted regarding new intake appointment. Date and time given. Information given regarding documents needed to qualify for financial eligibility.  Laurell Josephsammy K King, RN

## 2013-12-12 LAB — HIV-1 INTEGRASE GENOTYPE
DATE VIRAL LOAD COLLECTED: NO GROWTH
Value last viral load: NO GROWTH

## 2013-12-13 ENCOUNTER — Emergency Department (HOSPITAL_COMMUNITY): Payer: Medicare (Managed Care)

## 2013-12-13 ENCOUNTER — Encounter (HOSPITAL_COMMUNITY): Payer: Self-pay | Admitting: Emergency Medicine

## 2013-12-13 ENCOUNTER — Inpatient Hospital Stay (HOSPITAL_COMMUNITY)
Admission: EM | Admit: 2013-12-13 | Discharge: 2013-12-15 | DRG: 190 | Disposition: A | Discharge status: Home / Self Care | Payer: Medicare (Managed Care) | Attending: Internal Medicine | Admitting: Internal Medicine

## 2013-12-13 DIAGNOSIS — I252 Old myocardial infarction: Secondary | ICD-10-CM | POA: Diagnosis not present

## 2013-12-13 DIAGNOSIS — B20 Human immunodeficiency virus [HIV] disease: Secondary | ICD-10-CM | POA: Diagnosis present

## 2013-12-13 DIAGNOSIS — J45901 Unspecified asthma with (acute) exacerbation: Secondary | ICD-10-CM | POA: Diagnosis not present

## 2013-12-13 DIAGNOSIS — I359 Nonrheumatic aortic valve disorder, unspecified: Secondary | ICD-10-CM | POA: Diagnosis present

## 2013-12-13 DIAGNOSIS — IMO0002 Reserved for concepts with insufficient information to code with codable children: Secondary | ICD-10-CM | POA: Diagnosis present

## 2013-12-13 DIAGNOSIS — K219 Gastro-esophageal reflux disease without esophagitis: Secondary | ICD-10-CM | POA: Diagnosis present

## 2013-12-13 DIAGNOSIS — B977 Papillomavirus as the cause of diseases classified elsewhere: Secondary | ICD-10-CM

## 2013-12-13 DIAGNOSIS — Z9981 Dependence on supplemental oxygen: Secondary | ICD-10-CM | POA: Diagnosis not present

## 2013-12-13 DIAGNOSIS — J449 Chronic obstructive pulmonary disease, unspecified: Secondary | ICD-10-CM | POA: Diagnosis present

## 2013-12-13 DIAGNOSIS — J9620 Acute and chronic respiratory failure, unspecified whether with hypoxia or hypercapnia: Secondary | ICD-10-CM | POA: Diagnosis present

## 2013-12-13 DIAGNOSIS — F411 Generalized anxiety disorder: Secondary | ICD-10-CM | POA: Diagnosis present

## 2013-12-13 DIAGNOSIS — Z5900 Homelessness unspecified: Secondary | ICD-10-CM

## 2013-12-13 DIAGNOSIS — Z79899 Other long term (current) drug therapy: Secondary | ICD-10-CM

## 2013-12-13 DIAGNOSIS — Z888 Allergy status to other drugs, medicaments and biological substances status: Secondary | ICD-10-CM | POA: Diagnosis not present

## 2013-12-13 DIAGNOSIS — J438 Other emphysema: Secondary | ICD-10-CM

## 2013-12-13 DIAGNOSIS — R0989 Other specified symptoms and signs involving the circulatory and respiratory systems: Secondary | ICD-10-CM | POA: Diagnosis not present

## 2013-12-13 DIAGNOSIS — M109 Gout, unspecified: Secondary | ICD-10-CM | POA: Diagnosis present

## 2013-12-13 DIAGNOSIS — F17200 Nicotine dependence, unspecified, uncomplicated: Secondary | ICD-10-CM | POA: Diagnosis present

## 2013-12-13 DIAGNOSIS — J441 Chronic obstructive pulmonary disease with (acute) exacerbation: Principal | ICD-10-CM | POA: Diagnosis present

## 2013-12-13 DIAGNOSIS — R0789 Other chest pain: Secondary | ICD-10-CM

## 2013-12-13 DIAGNOSIS — J439 Emphysema, unspecified: Secondary | ICD-10-CM

## 2013-12-13 DIAGNOSIS — H9190 Unspecified hearing loss, unspecified ear: Secondary | ICD-10-CM | POA: Diagnosis present

## 2013-12-13 DIAGNOSIS — R0609 Other forms of dyspnea: Secondary | ICD-10-CM | POA: Diagnosis not present

## 2013-12-13 DIAGNOSIS — IMO0001 Reserved for inherently not codable concepts without codable children: Secondary | ICD-10-CM

## 2013-12-13 DIAGNOSIS — H9193 Unspecified hearing loss, bilateral: Secondary | ICD-10-CM

## 2013-12-13 LAB — CBC WITH DIFFERENTIAL/PLATELET
BASOS PCT: 0 % (ref 0–1)
Basophils Absolute: 0 10*3/uL (ref 0.0–0.1)
EOS ABS: 0.4 10*3/uL (ref 0.0–0.7)
EOS PCT: 3 % (ref 0–5)
HEMATOCRIT: 43.9 % (ref 39.0–52.0)
Hemoglobin: 13.7 g/dL (ref 13.0–17.0)
LYMPHS ABS: 5.6 10*3/uL — AB (ref 0.7–4.0)
Lymphocytes Relative: 45 % (ref 12–46)
MCH: 29.1 pg (ref 26.0–34.0)
MCHC: 31.2 g/dL (ref 30.0–36.0)
MCV: 93.2 fL (ref 78.0–100.0)
Monocytes Absolute: 1.5 10*3/uL — ABNORMAL HIGH (ref 0.1–1.0)
Monocytes Relative: 12 % (ref 3–12)
NEUTROS ABS: 5 10*3/uL (ref 1.7–7.7)
NEUTROS PCT: 40 % — AB (ref 43–77)
Platelets: 169 10*3/uL (ref 150–400)
RBC: 4.71 MIL/uL (ref 4.22–5.81)
RDW: 14 % (ref 11.5–15.5)
WBC: 12.5 10*3/uL — ABNORMAL HIGH (ref 4.0–10.5)

## 2013-12-13 LAB — BASIC METABOLIC PANEL
Anion gap: 10 (ref 5–15)
BUN: 8 mg/dL (ref 6–23)
CO2: 35 meq/L — AB (ref 19–32)
Calcium: 9.2 mg/dL (ref 8.4–10.5)
Chloride: 101 mEq/L (ref 96–112)
Creatinine, Ser: 0.78 mg/dL (ref 0.50–1.35)
GFR calc Af Amer: >90 mL/min (ref >90)
GFR calc non Af Amer: >90 mL/min (ref >90)
GLUCOSE: 132 mg/dL — AB (ref 70–99)
Potassium: 4.9 mEq/L (ref 3.7–5.3)
SODIUM: 146 meq/L (ref 137–147)

## 2013-12-13 MED ORDER — ALBUTEROL (5 MG/ML) CONTINUOUS INHALATION SOLN
10.0000 mg/h | INHALATION_SOLUTION | Freq: Once | RESPIRATORY_TRACT | Status: AC
  Administered 2013-12-13: 10 mg/h via RESPIRATORY_TRACT
  Filled 2013-12-13: qty 20

## 2013-12-13 MED ORDER — ALBUTEROL SULFATE (2.5 MG/3ML) 0.083% IN NEBU
2.5000 mg | INHALATION_SOLUTION | RESPIRATORY_TRACT | Status: DC
  Administered 2013-12-13 (×2): 2.5 mg via RESPIRATORY_TRACT
  Filled 2013-12-13: qty 3

## 2013-12-13 MED ORDER — ALBUTEROL SULFATE HFA 108 (90 BASE) MCG/ACT IN AERS
2.0000 | INHALATION_SPRAY | RESPIRATORY_TRACT | Status: DC
  Administered 2013-12-13: 2 via RESPIRATORY_TRACT
  Filled 2013-12-13: qty 6.7

## 2013-12-13 MED ORDER — ENOXAPARIN SODIUM 40 MG/0.4ML ~~LOC~~ SOLN
40.0000 mg | SUBCUTANEOUS | Status: DC
  Administered 2013-12-13 – 2013-12-14 (×2): 40 mg via SUBCUTANEOUS
  Filled 2013-12-13 (×3): qty 0.4

## 2013-12-13 MED ORDER — GUAIFENESIN ER 600 MG PO TB12
600.0000 mg | ORAL_TABLET | Freq: Two times a day (BID) | ORAL | Status: DC
  Administered 2013-12-13 – 2013-12-15 (×4): 600 mg via ORAL
  Filled 2013-12-13 (×7): qty 1

## 2013-12-13 MED ORDER — LEVOFLOXACIN 500 MG PO TABS
500.0000 mg | ORAL_TABLET | Freq: Every day | ORAL | Status: DC
  Administered 2013-12-13 – 2013-12-15 (×3): 500 mg via ORAL
  Filled 2013-12-13 (×3): qty 1

## 2013-12-13 MED ORDER — ALBUTEROL SULFATE (2.5 MG/3ML) 0.083% IN NEBU: 2.5000 mg | INHALATION_SOLUTION | RESPIRATORY_TRACT | Status: DC | PRN

## 2013-12-13 MED ORDER — ALPRAZOLAM 0.5 MG PO TABS
0.5000 mg | ORAL_TABLET | Freq: Two times a day (BID) | ORAL | Status: DC | PRN
  Administered 2013-12-13: 0.5 mg via ORAL
  Filled 2013-12-13: qty 1

## 2013-12-13 MED ORDER — ACETAMINOPHEN 325 MG PO TABS
650.0000 mg | ORAL_TABLET | Freq: Four times a day (QID) | ORAL | Status: DC | PRN
  Administered 2013-12-14: 650 mg via ORAL
  Filled 2013-12-13: qty 2

## 2013-12-13 MED ORDER — ONDANSETRON HCL 4 MG/2ML IJ SOLN: 4.0000 mg | Freq: Four times a day (QID) | INTRAMUSCULAR | Status: DC | PRN

## 2013-12-13 MED ORDER — ALPRAZOLAM 0.25 MG PO TABS
0.2500 mg | ORAL_TABLET | Freq: Once | ORAL | Status: AC
  Administered 2013-12-13: 0.25 mg via ORAL
  Filled 2013-12-13: qty 1

## 2013-12-13 MED ORDER — NICOTINE 14 MG/24HR TD PT24
14.0000 mg | MEDICATED_PATCH | Freq: Every day | TRANSDERMAL | Status: DC
  Filled 2013-12-13 (×3): qty 1

## 2013-12-13 MED ORDER — METHYLPREDNISOLONE SODIUM SUCC 125 MG IJ SOLR
60.0000 mg | Freq: Two times a day (BID) | INTRAMUSCULAR | Status: DC
  Administered 2013-12-13: 60 mg via INTRAVENOUS
  Filled 2013-12-13 (×4): qty 0.96

## 2013-12-13 MED ORDER — ALBUTEROL SULFATE (2.5 MG/3ML) 0.083% IN NEBU
INHALATION_SOLUTION | RESPIRATORY_TRACT | Status: AC
  Filled 2013-12-13: qty 3

## 2013-12-13 MED ORDER — IPRATROPIUM-ALBUTEROL 0.5-2.5 (3) MG/3ML IN SOLN
3.0000 mL | Freq: Four times a day (QID) | RESPIRATORY_TRACT | Status: DC
  Administered 2013-12-13 – 2013-12-14 (×3): 3 mL via RESPIRATORY_TRACT
  Filled 2013-12-13 (×3): qty 3

## 2013-12-13 MED ORDER — SODIUM CHLORIDE 0.9 % IV SOLN
INTRAVENOUS | Status: DC
  Administered 2013-12-13: 125 mL/h via INTRAVENOUS

## 2013-12-13 MED ORDER — PREDNISONE 20 MG PO TABS
40.0000 mg | ORAL_TABLET | Freq: Once | ORAL | Status: DC
  Filled 2013-12-13: qty 2

## 2013-12-13 MED ORDER — ENSURE COMPLETE PO LIQD
237.0000 mL | Freq: Four times a day (QID) | ORAL | Status: DC
  Administered 2013-12-13 – 2013-12-15 (×7): 237 mL via ORAL

## 2013-12-13 MED ORDER — SODIUM CHLORIDE 0.9 % IV BOLUS (SEPSIS)
1000.0000 mL | Freq: Once | INTRAVENOUS | Status: AC
  Administered 2013-12-13: 1000 mL via INTRAVENOUS

## 2013-12-13 MED ORDER — SENNA 8.6 MG PO TABS
1.0000 | ORAL_TABLET | Freq: Every day | ORAL | Status: DC
  Administered 2013-12-13 – 2013-12-14 (×2): 8.6 mg via ORAL
  Filled 2013-12-13 (×3): qty 1

## 2013-12-13 MED ORDER — METHYLPREDNISOLONE SODIUM SUCC 125 MG IJ SOLR: 125.0000 mg | Freq: Once | INTRAMUSCULAR | Status: DC

## 2013-12-13 NOTE — ED Notes (Signed)
Patient arrived via [facility] from home with shortness of breath. EMS administered Albuterol 5mg , Duo neb and solumedrol 125 mg. IV access, EKG ST. Patient is deaf but is able to communicate with writing at this time. Interrupter has been contacted. VSS stable, A/O.

## 2013-12-13 NOTE — Progress Notes (Signed)
On call physician paged for stool softener or laxative per pt request.

## 2013-12-13 NOTE — ED Notes (Signed)
Patient refuses to wear breathing treatment. EDP made aware.

## 2013-12-13 NOTE — ED Notes (Signed)
EDP, Consulting civil engineerCharge RN and attending nurse at bedside with translator to inform patient of disadvantage and consequences of patient leaving the hospital without receiving the treatment needed for his breathing. Patient has agreed to seen by admitting physician about being admitted for his respiratory status. Patient asked for something for his anxiety. EDP ordered xanax. Patient is calm and cooperative at this time. Translator at bedside.

## 2013-12-13 NOTE — Progress Notes (Signed)
On call physician notified that pt is refusing IV start from IV team. Pt states he has had too many IVs. Pt is refusing steroids - pt states they make him shake.

## 2013-12-13 NOTE — Progress Notes (Signed)
INITIAL NUTRITION ASSESSMENT  DOCUMENTATION CODES Per approved criteria  -Not Applicable   INTERVENTION: - Ensure Complete QID - Ordered snacks 3 times/day for pt - RD to monitor plan of care   NUTRITION DIAGNOSIS: Increased nutrient needs related to HIV/AIDS as evidenced by MD notes.   Goal: Pt to consume 100% of meals/supplements and snacks  Monitor:  Weights, labs, intake  Reason for Assessment: Malnutrition screening tool   53 y.o. male  Admitting Dx: Dyspnea and wheezing   ASSESSMENT: Pt with hx of HIV/AIDS and deafness, admitted with cough, congestion, and wheezing that worsened.   - Communicated via writing down questions on sheet of paper and pt wrote in responses - Reported eating only 1 meal/day at home of meals that included some protein and vegetables - Noted 30 pound unintended weight loss in the past week with poor appetite however no weight available  - Denied any nausea, agreeable to Ensure Complete - Per nursing, has been eating a lot of peanut butter and crackers - Noted what snacks pt prefers, ordered them for pt    Height: Ht Readings from Last 1 Encounters:  12/06/13 6\' 3"  (1.905 m)    Weight: Wt Readings from Last 1 Encounters:  12/06/13 282 lb 6.4 oz (128.096 kg)    Ideal Body Weight: 196 lbs   % Ideal Body Weight: Unsure, no current weight available  Wt Readings from Last 10 Encounters:  12/06/13 282 lb 6.4 oz (128.096 kg)    Usual Body Weight: Unsure   % Usual Body Weight: Unsure, no current weight available  BMI:  There is no weight on file to calculate BMI.  Estimated Nutritional Needs: Kcal: 2300-2500 Protein: 135-155g Fluid: 2.3-2.5L/day   Skin: Intact   Diet Order: General  EDUCATION NEEDS: -No education needs identified at this time   Intake/Output Summary (Last 24 hours) at 12/13/13 1635 Last data filed at 12/13/13 0650  Gross per 24 hour  Intake    240 ml  Output      0 ml  Net    240 ml    Last BM:  PTA  Labs:   Recent Labs Lab 12/13/13 0443  NA 146  K 4.9  CL 101  CO2 35*  BUN 8  CREATININE 0.78  CALCIUM 9.2  GLUCOSE 132*    CBG (last 3)  No results found for this basename: GLUCAP,  in the last 72 hours  Scheduled Meds: . enoxaparin (LOVENOX) injection  40 mg Subcutaneous Q24H  . guaiFENesin  600 mg Oral BID  . ipratropium-albuterol  3 mL Nebulization QID  . levofloxacin  500 mg Oral Daily  . methylPREDNISolone (SOLU-MEDROL) injection  60 mg Intravenous Q12H    Continuous Infusions: . sodium chloride 75 mL/hr (12/13/13 1249)    Past Medical History  Diagnosis Date  . AIDS   . COPD (chronic obstructive pulmonary disease)   . Myocardial infarction ~ 2005  . Asthma   . On home oxygen therapy     "3L; 24/7" (12/04/2013)  . Pneumonia     "several times"  . Chronic bronchitis     "get it q yr" (12/04/2013)  . GERD (gastroesophageal reflux disease)   . Stomach ulcer   . Hepatitis B   . Arthritis     "left knee" (12/04/2013)  . Gout   . 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 Deafness     "BORN THAT WAY"    Past Surgical History  Procedure Laterality Date  . Knee surgery  Left ~ 2005    "drained fluid off"  . Excisional hemorrhoidectomy  ~ [address]2007    Leonard Hendler MS, RD, [place] [phone] Weekend/After Hours Pager

## 2013-12-13 NOTE — Progress Notes (Signed)
On call physcian notified that pt is refusing to wear tele.

## 2013-12-13 NOTE — Progress Notes (Signed)
Patient refused to wear treatment the whole time.

## 2013-12-13 NOTE — Progress Notes (Signed)
Lawrence [name] [id number] Admission Data: 12/13/2013 11:52 AM Attending Provider: Zannie CovePreetha Joseph, MD  PCP:No PCP Per Patient Consults/ Treatment Team:    Lawrence [name] is a 53 y.o. male patient admitted from ED awake, alert  & orientated  X 3,  Prior, VSS - Blood pressure 97/54, pulse 96, temperature 98.2 F (36.8 C), temperature source Oral, resp. rate 26, SpO2 97.00%., O2    2 L nasal cannular, no c/o shortness of breath, no c/o chest pain, no distress noted. Tele # 10 placed and pt is currently running:normal sinus rhythm.   IV site WDL:  hand left, condition patent and no redness with a transparent dsg that's clean dry and intact.  Allergies:   Allergies  Allergen Reactions  . Bactrim [Sulfamethoxazole-Tmp Ds]     Fungus around his mouth  . Benadryl [Diphenhydramine Hcl (Sleep)] Itching     Past Medical History  Diagnosis Date  . AIDS   . COPD (chronic obstructive pulmonary disease)   . Myocardial infarction ~ 2005  . Asthma   . On home oxygen therapy     "3L; 24/7" (12/04/2013)  . Pneumonia     "several times"  . Chronic bronchitis     "get it q yr" (12/04/2013)  . GERD (gastroesophageal reflux disease)   . Stomach ulcer   . Hepatitis B   . Arthritis     "left knee" (12/04/2013)  . Gout   . 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 Deafness     "BORN THAT WAY"    Pt orientation to unit, room and routine. Information packet given to patient/family and safety video watched.  Admission INP armband ID verified with patient/family, and in place. SR up x 2, fall risk assessment complete with Patient and family verbalizing understanding of risks associated with falls. Pt verbalizes an understanding of how to use the call bell and to call for help before getting out of bed.  Skin, clean-dry- intact without evidence of bruising, or skin tears.   No evidence of skin break down noted on exam.  Will cont to monitor and assist as needed.  Jentry Mcqueary, Brooke BonitoKrystal A, RN 12/13/2013 11:52 AM

## 2013-12-13 NOTE — H&P (Signed)
[facility] History and Physical  Lawrence [name] ZOX:[id number] DOB: [birth year] DOA: 12/13/2013  Referring physician: EDP PCP: No PCP Per Patient   Chief Complaint: dyspnea, wheezing  HPI: Lawrence PicaCurtis Fullard is a 53 y.o. male with PMH of HIV/AIDS not on HAART, Deafness, COPD supposed to be on Home O2, recently moved to [place]  from [place] in the last 2 months. Seen with a speech interpreter. He used to be on Home O2 for COPD but couldn't tell me why he stopped this, he had stopped smoking but resumed this sporadically now. Yesterday he noticed some cough, congestion and wheezing, this worsened this am and hence presented to the ER. He denies any fever, no chest pain. Was a little agitated in the ER and hence given some xanax and now very drowsy, hence history limited. Denies having any inhalers at home for his COPD. In ER, CXR clear, mild leukocytosis, tachypneic and wheezing   Review of Systems:  Constitutional:  No weight loss, night sweats, Fevers, chills, fatigue.  HEENT:  No headaches, Difficulty swallowing,Tooth/dental problems,Sore throat,  No sneezing, itching, ear ache, nasal congestion, post nasal drip,  Cardio-vascular:  No chest pain, Orthopnea, PND, swelling in lower extremities, anasarca, dizziness, palpitations  GI:  No heartburn, indigestion, abdominal pain, nausea, vomiting, diarrhea, change in bowel habits, loss of appetite  Resp:  No shortness of breath with exertion or at rest. No excess mucus, no productive cough, No non-productive cough, No coughing up of blood.No change in color of mucus.No wheezing.No chest wall deformity  Skin:  no rash or lesions.  GU:  no dysuria, change in color of urine, no urgency or frequency. No flank pain.  Musculoskeletal:  No joint pain or swelling. No decreased range of motion. No back pain.  Psych:  No change in mood or affect. No depression or anxiety. No memory loss.   Past Medical History  Diagnosis Date  . AIDS    . COPD (chronic obstructive pulmonary disease)   . Myocardial infarction ~ 2005  . Asthma   . On home oxygen therapy     "3L; 24/7" (12/04/2013)  . Pneumonia     "several times"  . Chronic bronchitis     "get it q yr" (12/04/2013)  . GERD (gastroesophageal reflux disease)   . Stomach ulcer   . Hepatitis B   . Arthritis     "left knee" (12/04/2013)  . Gout   . 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 Deafness     "BORN THAT WAY"   Past Surgical History  Procedure Laterality Date  . Knee surgery Left ~ 2005    "drained fluid off"  . Excisional hemorrhoidectomy  ~ 2007   Social History:  reports that he has been smoking Cigarettes.  He has a 4.32 pack-year smoking history. He has never used smokeless tobacco. He reports that he does not drink alcohol or use illicit drugs.  Allergies  Allergen Reactions  . Bactrim [Sulfamethoxazole-Tmp Ds]     Fungus around his mouth  . Benadryl [Diphenhydramine Hcl (Sleep)] Itching    History reviewed. No pertinent family history.   Prior to Admission medications   Not on File   Physical Exam: Filed Vitals:   12/13/13 0849  BP: 97/54  Pulse: 96  Temp: 98.2 F (36.8 C)  Resp: 26    BP 97/54  Pulse 96  Temp(Src) 98.2 F (36.8 C) (Oral)  Resp 26  SpO2 97%  General:  Sleeping, arousible, answers few questions only, oriented to self,  place and partly to time Eyes: PERRL, normal lids, irises & conjunctiva ENT: grossly normal hearing, lips & tongue, no thrush noted Neck: no LAD, masses or thyromegaly Cardiovascular: RRR, systolic murmur Respiratory: poor air movement, scattered exp wheezes. Abdomen: soft, Nt, ND, BS present Skin: no rash or induration seen on limited exam Musculoskeletal: grossly normal tone BUE/BLE Psychiatric: grossly normal mood and affect, speech fluent and appropriate Neurologic: grossly non-focal.          Labs on Admission:  Basic Metabolic Panel:  Recent Labs Lab 12/13/13 0443  NA 146  K 4.9  CL 101  CO2 35*    GLUCOSE 132*  BUN 8  CREATININE 0.78  CALCIUM 9.2   Liver Function Tests: No results found for this basename: AST, ALT, ALKPHOS, BILITOT, PROT, ALBUMIN,  in the last 168 hours No results found for this basename: LIPASE, AMYLASE,  in the last 168 hours No results found for this basename: AMMONIA,  in the last 168 hours CBC:  Recent Labs Lab 12/13/13 0443  WBC 12.5*  NEUTROABS 5.0  HGB 13.7  HCT 43.9  MCV 93.2  PLT 169   Cardiac Enzymes: No results found for this basename: CKTOTAL, CKMB, CKMBINDEX, TROPONINI,  in the last 168 hours  BNP (last 3 results)  Recent Labs  12/04/13 1623  PROBNP 337.4*   CBG: No results found for this basename: GLUCAP,  in the last 168 hours  Radiological Exams on Admission: Dg Chest Port 1 View  12/13/2013   CLINICAL DATA:  pain  EXAM: PORTABLE CHEST - 1 VIEW  COMPARISON:  Prior radiograph from 12/04/2013  FINDINGS: The cardiac and mediastinal silhouettes are stable in size and contour, and remain within normal limits. Pulmonary arteries are mildly prominent, stable from prior.  The lungs are normally inflated. No airspace consolidation, pleural effusion, or pulmonary edema is identified. There is no pneumothorax.  No acute osseous abnormality identified.  IMPRESSION: No acute cardiopulmonary abnormality.   Electronically Signed   By: Rise MuBenjamin  McClintock M.D.   On: 12/13/2013 05:27    EKG: Independently reviewed. NSR, non specific IVCD  Assessment/Plan 1. COPD exacerbation -admit to tele -duonebs, solumedrol -Levaquin -mucinex -supposed to be on Home O2 but hasnt had this for a while -will need Ambulatory O2 sats prior to discharge  2. HIV/AIDS -for >20years per pt report, Viral load 277K and CD4 of 230 -not on any meds currently -has appt at [facility] on 7/29 to establish care  3. Moderate AS  4. Congenital Deafness  DVT proph: lovenox  Code Status: Full COde Family Communication: none at bedside Disposition Plan:  inpatient  Time spent: 60min  [facility] LLCJOSEPH,Thecla Forgione [facility] Pager [phone]  **Disclaimer: This note may have been dictated with voice recognition software. Similar sounding words can inadvertently be transcribed and this note may contain transcription errors which may not have been corrected upon publication of note.**

## 2013-12-13 NOTE — ED Provider Notes (Signed)
CSN: [id number]     Arrival date & time 12/13/13  0426 History   First MD Initiated Contact with Patient 12/13/13 0435     No chief complaint on file.    (Consider location/radiation/quality/duration/timing/severity/associated sxs/prior Treatment) Patient is a 53 y.o. male presenting with shortness of breath. The history is provided by the patient and the EMS personnel. The history is limited by the condition of the patient.  Shortness of Breath  Patient here with 2 days of cough and shortness of breath. Exam is limited by the patient is deaf and there is no silent interpreter. He does read lips and I did use a pad to indicate. Patient has a history of HIV and is due to the area is not on medications, at this time. Was given albuterol and Atrovent and transported here by EMS. Past Medical History  Diagnosis Date  . AIDS   . COPD (chronic obstructive pulmonary disease)   . Myocardial infarction ~ 2005  . Asthma   . On home oxygen therapy     "3L; 24/7" (12/04/2013)  . Pneumonia     "several times"  . Chronic bronchitis     "get it q yr" (12/04/2013)  . GERD (gastroesophageal reflux disease)   . Stomach ulcer   . Hepatitis B   . Arthritis     "left knee" (12/04/2013)  . Gout   . 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 Deafness     "BORN THAT WAY"   Past Surgical History  Procedure Laterality Date  . Knee surgery Left ~ 2005    "drained fluid off"  . Excisional hemorrhoidectomy  ~ 2007   No family history on file. History  Substance Use Topics  . Smoking status: Current Every Day Smoker -- 0.12 packs/day for 36 years    Types: Cigarettes  . Smokeless tobacco: Never Used  . Alcohol Use: No    Review of Systems  Unable to perform ROS Respiratory: Positive for shortness of breath.       Allergies  Bactrim and Benadryl  Home Medications   Prior to Admission medications   Medication Sig Start Date End Date Taking? Authorizing Provider  busPIRone (BUSPAR) 10 MG tablet Take 1 tablet (10 mg  total) by mouth 3 (three) times daily. 12/06/13   Rhetta MuraJai-Gurmukh Samtani, MD  metoprolol tartrate (LOPRESSOR) 12.5 mg TABS tablet Take 0.5 tablets (12.5 mg total) by mouth 2 (two) times daily. 12/06/13   Rhetta MuraJai-Gurmukh Samtani, MD   There were no vitals taken for this visit. Physical Exam  Nursing note and vitals reviewed. Constitutional: He is oriented to person, place, and time. He appears well-developed and well-nourished.  Non-toxic appearance. No distress.  HENT:  Head: Normocephalic and atraumatic.  Eyes: Conjunctivae, EOM and lids are normal. Pupils are equal, round, and reactive to light.  Neck: Normal range of motion. Neck supple. No tracheal deviation present. No mass present.  Cardiovascular: Normal rate, regular rhythm and normal heart sounds.  Exam reveals no gallop.   No murmur heard. Pulmonary/Chest: Effort normal. No stridor. No respiratory distress. He has decreased breath sounds. He has wheezes. He has no rhonchi. He has no rales.  Abdominal: Soft. Normal appearance and bowel sounds are normal. He exhibits no distension. There is no tenderness. There is no rebound and no CVA tenderness.  Musculoskeletal: Normal range of motion. He exhibits no edema and no tenderness.  Neurological: He is alert and oriented to person, place, and time. He has normal strength. No cranial nerve  deficit or sensory deficit. GCS eye subscore is 4. GCS verbal subscore is 5. GCS motor subscore is 6.  Skin: Skin is warm and dry. No abrasion and no rash noted.  Psychiatric: He has a normal mood and affect. His speech is normal and behavior is normal.    ED Course  Procedures (including critical care time) Labs Review Labs Reviewed  CBC WITH DIFFERENTIAL  BASIC METABOLIC PANEL    Imaging Review No results found.   EKG Interpretation   Date/Time:  Saturday December 13 2013 04:47:19 EDT Ventricular Rate:  115 PR Interval:  123 QRS Duration: 129 QT Interval:  349 QTC Calculation: 483 R Axis:    -89 Text Interpretation:  Sinus tachycardia Nonspecific IVCD with LAD Inferior  infarct, age indeterminate Abnormal lateral Q waves Repol abnrm, severe  global ischemia (LM/MVD) No significant change since last tracing  Confirmed by Thomas Rhude  MD, Riley Hallum ([id number]) on 12/13/2013 5:34:08 AM      MDM   Final diagnoses:  None    Patient given albuterol treatment here along with Xanax for his anxiety. He remains tachypnic and will require admission for observation    Toy BakerAnthony T Yahsir Wickens, MD 12/13/13 [phone]

## 2013-12-14 MED ORDER — IPRATROPIUM-ALBUTEROL 0.5-2.5 (3) MG/3ML IN SOLN
3.0000 mL | Freq: Four times a day (QID) | RESPIRATORY_TRACT | Status: DC
  Administered 2013-12-15: 3 mL via RESPIRATORY_TRACT
  Filled 2013-12-14 (×3): qty 3

## 2013-12-14 MED ORDER — FLEET ENEMA 7-19 GM/118ML RE ENEM
1.0000 | ENEMA | Freq: Every day | RECTAL | Status: DC | PRN
  Filled 2013-12-14: qty 1

## 2013-12-14 MED ORDER — PREDNISONE 10 MG PO TABS
60.0000 mg | ORAL_TABLET | Freq: Every day | ORAL | Status: DC
  Filled 2013-12-14 (×2): qty 1

## 2013-12-14 MED ORDER — POLYETHYLENE GLYCOL 3350 17 G PO PACK
17.0000 g | PACK | Freq: Two times a day (BID) | ORAL | Status: DC
  Administered 2013-12-14 – 2013-12-15 (×2): 17 g via ORAL
  Filled 2013-12-14 (×4): qty 1

## 2013-12-14 MED ORDER — OXYCODONE HCL 5 MG PO TABS
5.0000 mg | ORAL_TABLET | Freq: Four times a day (QID) | ORAL | Status: DC | PRN
  Administered 2013-12-15 (×2): 5 mg via ORAL
  Filled 2013-12-14 (×3): qty 1

## 2013-12-14 MED ORDER — FUROSEMIDE 20 MG PO TABS
20.0000 mg | ORAL_TABLET | Freq: Once | ORAL | Status: AC
  Administered 2013-12-14: 20 mg via ORAL
  Filled 2013-12-14: qty 1

## 2013-12-14 MED ORDER — FUROSEMIDE 10 MG/ML IJ SOLN: 20.0000 mg | Freq: Once | INTRAMUSCULAR | Status: DC

## 2013-12-14 MED ORDER — ALPRAZOLAM 0.5 MG PO TABS
0.5000 mg | ORAL_TABLET | Freq: Three times a day (TID) | ORAL | Status: DC | PRN
  Administered 2013-12-15: 0.5 mg via ORAL
  Filled 2013-12-14: qty 1

## 2013-12-14 MED ORDER — MAGNESIUM CITRATE PO SOLN
1.0000 | Freq: Once | ORAL | Status: AC
  Administered 2013-12-14: 1 via ORAL
  Filled 2013-12-14: qty 296

## 2013-12-14 MED ORDER — POLYETHYLENE GLYCOL 3350 17 G PO PACK
17.0000 g | PACK | Freq: Every day | ORAL | Status: DC
  Administered 2013-12-14: 17 g via ORAL
  Filled 2013-12-14: qty 1

## 2013-12-14 MED ORDER — METHYLPREDNISOLONE SODIUM SUCC 125 MG IJ SOLR
60.0000 mg | Freq: Two times a day (BID) | INTRAMUSCULAR | Status: DC
  Filled 2013-12-14 (×4): qty 0.96

## 2013-12-14 NOTE — Progress Notes (Signed)
Pt refuse AM labs - on call physician notified.

## 2013-12-14 NOTE — Progress Notes (Signed)
PATIENT DETAILS Name: Lawrence [name] Age: 53 y.o. Sex: male Date of Birth: [birth year] Admit Date: 12/13/2013 Admitting Physician Zannie Cove, MD PCP:No PCP Per Patient  Subjective: Essentially unchanged, wants to go home  Assessment/Plan: Active Problems: COPD exacerbation -admitted and started on IV Solumedrol, nebs and empiric Levaquin, good air entry on exam, some scattered rhonchi.But overall improved, ambulating in hallway without difficulty, translator at bedside-patient not in any distress at all. -CXR neg for PNA  Acute on Chronic Resp Failure -apparently previously on Home O2 -c/w O2-taper as tolerated, assess for Home O2 requirement prior to discharge  HIV/AIDS  -for >20years per pt report, Viral load 277K and CD4 of 230  -not on any meds currently  -has appt at [facility] on 7/29 to establish care  Tobacco Abuse -c/w Transdermal Nicotine -counseled extensively  Anxiety -stable -prn Xanax  Aortic Stenosis -moderate per last Echo -will need outpatient surveillance and monitoring  Congenital Deafness -translator at bedside  Hom[place]  Disposition: Remain inpatient  DVT Prophylaxis: Prophylactic Lovenox   Code Status: Full code   Family Communication None  Procedures:  None  CONSULTS:  None  Time spent 40 minutes-which includes 50% of the time with face-to-face with patient/ family and coordinating care related to the above assessment and plan.    MEDICATIONS: Scheduled Meds: . enoxaparin (LOVENOX) injection  40 mg Subcutaneous Q24H  . feeding supplement (ENSURE COMPLETE)  237 mL Oral QID  . guaiFENesin  600 mg Oral BID  . ipratropium-albuterol  3 mL Nebulization Q6H  . levofloxacin  500 mg Oral Daily  . methylPREDNISolone (SOLU-MEDROL) injection  60 mg Intravenous Q12H  . nicotine  14 mg Transdermal Daily  . polyethylene glycol  17 g Oral Daily  . predniSONE  40 mg Oral Once  . senna  1  tablet Oral QHS   Continuous Infusions:  PRN Meds:.acetaminophen, albuterol, ALPRAZolam, ondansetron (ZOFRAN) IV, sodium phosphate  Antibiotics: Anti-infectives   Start     Dose/Rate Route Frequency Ordered Stop   12/13/13 1430  levofloxacin (LEVAQUIN) tablet 500 mg     500 mg Oral Daily 12/13/13 1259         PHYSICAL EXAM: Vital signs in last 24 hours: Filed Vitals:   12/13/13 2216 12/13/13 2241 12/14/13 0638 12/14/13 0842  BP: 132/75  108/70   Pulse: 97  79   Temp: 98 F (36.7 C)  97.7 F (36.5 C)   TempSrc: Oral  Oral   Resp: 20  20   Height:  5\' 7"  (1.702 m)    Weight:  96.843 kg (213 lb 8 oz) 95.845 kg (211 lb 4.8 oz)   SpO2: 96%  98% 98%    Weight change:  Filed Weights   12/13/13 2241 12/14/13 0638  Weight: 96.843 kg (213 lb 8 oz) 95.845 kg (211 lb 4.8 oz)   Body mass index is 33.09 kg/(m^2).   Gen Exam: Awake and alert  Neck: Supple, No JVD.   Chest: Good air entry bilaterally, rhonchi CVS: S1 S2 Regular, no murmurs.  Abdomen: soft, BS +, non tender, non distended.  Extremities: no edema, lower extremities warm to touch. Neurologic: Non Focal.   Skin: No Rash.   Wounds: N/A.    Intake/Output from previous day:  Intake/Output Summary (Last 24 hours) at 12/14/13 0936 Last data filed at 12/14/13 0824  Gross per 24 hour  Intake 1585.42 ml  Output    450  ml  Net 1135.42 ml     LAB RESULTS: CBC  Recent Labs Lab 12/13/13 0443  WBC 12.5*  HGB 13.7  HCT 43.9  PLT 169  MCV 93.2  MCH 29.1  MCHC 31.2  RDW 14.0  LYMPHSABS 5.6*  MONOABS 1.5*  EOSABS 0.4  BASOSABS 0.0    Chemistries   Recent Labs Lab 12/13/13 0443  NA 146  K 4.9  CL 101  CO2 35*  GLUCOSE 132*  BUN 8  CREATININE 0.78  CALCIUM 9.2    CBG: No results found for this basename: GLUCAP,  in the last 168 hours  GFR Estimated Creatinine Clearance: 121.9 ml/min (by C-G formula based on Cr of 0.78).  Coagulation profile No results found for this basename: INR, PROTIME,   in the last 168 hours  Cardiac Enzymes No results found for this basename: CK, CKMB, TROPONINI, MYOGLOBIN,  in the last 168 hours  No components found with this basename: POCBNP,  No results found for this basename: DDIMER,  in the last 72 hours No results found for this basename: HGBA1C,  in the last 72 hours No results found for this basename: CHOL, HDL, LDLCALC, TRIG, CHOLHDL, LDLDIRECT,  in the last 72 hours No results found for this basename: TSH, T4TOTAL, FREET3, T3FREE, THYROIDAB,  in the last 72 hours No results found for this basename: VITAMINB12, FOLATE, FERRITIN, TIBC, IRON, RETICCTPCT,  in the last 72 hours No results found for this basename: LIPASE, AMYLASE,  in the last 72 hours  Urine Studies No results found for this basename: UACOL, UAPR, USPG, UPH, UTP, UGL, UKET, UBIL, UHGB, UNIT, UROB, ULEU, UEPI, UWBC, URBC, UBAC, CAST, CRYS, UCOM, BILUA,  in the last 72 hours  MICROBIOLOGY: No results found for this or any previous visit (from the past 240 hour(s)).  RADIOLOGY STUDIES/RESULTS: Dg Chest 2 View (if Patient Has Fever And/or Copd)  12/04/2013   CLINICAL DATA:  Cough and shortness of Breath  EXAM: CHEST  2 VIEW  COMPARISON:  None.  FINDINGS: The heart size and mediastinal contours are within normal limits. Both lungs are clear. The visualized skeletal structures are unremarkable.  IMPRESSION: No active cardiopulmonary disease.   Electronically Signed   By: Alcide CleverMark  Lukens M.D.   On: 12/04/2013 13:07   Nm Myocar Multi W/spect W/wall Motion / Ef  12/06/2013   CLINICAL DATA:  Chest pain  EXAM: MYOCARDIAL IMAGING WITH SPECT (REST AND PHARMACOLOGIC-STRESS)  GATED LEFT VENTRICULAR WALL MOTION STUDY  LEFT VENTRICULAR EJECTION FRACTION  TECHNIQUE: Standard myocardial SPECT imaging was performed after resting intravenous injection of 10 mCi Tc-2458m sestamibi. Subsequently, intravenous infusion of Lexiscan was performed under the supervision of the Cardiology staff. At peak effect of the  drug, 30 mCi Tc-2558m sestamibi was injected intravenously and standard myocardial SPECT imaging was performed. Quantitative gated imaging was also performed to evaluate left ventricular wall motion, and estimate left ventricular ejection fraction.  COMPARISON:  None.  FINDINGS: SPECT: Large fixed defect involving the apex and inferior wall. No definite stress-induced ischemia.  Wall motion:  Normal motion  Ejection fraction: 53%. End-diastolic volume 105 cc. End systolic volume 50 cc.  IMPRESSION: Fixed defect in the inferior wall and apex. No evidence of stress-induced ischemia   Electronically Signed   By: Maryclare BeanArt  Hoss M.D.   On: 12/06/2013 14:11   Dg Chest Port 1 View  12/13/2013   CLINICAL DATA:  pain  EXAM: PORTABLE CHEST - 1 VIEW  COMPARISON:  Prior radiograph from 12/04/2013  FINDINGS: The cardiac and mediastinal silhouettes are stable in size and contour, and remain within normal limits. Pulmonary arteries are mildly prominent, stable from prior.  The lungs are normally inflated. No airspace consolidation, pleural effusion, or pulmonary edema is identified. There is no pneumothorax.  No acute osseous abnormality identified.  IMPRESSION: No acute cardiopulmonary abnormality.   Electronically Signed   By: Rise MuBenjamin  McClintock M.D.   On: 12/13/2013 05:27    Jeoffrey MassedGHIMIRE,Crista Nuon, MD  [facility] Pager:[phone]  If 7PM-7AM, please contact night-coverage [email or web address] Password [email or web address] 12/14/2013, 9:36 AM   LOS: 1 day   **Disclaimer: This note may have been dictated with voice recognition software. Similar sounding words can inadvertently be transcribed and this note may contain transcription errors which may not have been corrected upon publication of note.**

## 2013-12-15 DIAGNOSIS — J449 Chronic obstructive pulmonary disease, unspecified: Secondary | ICD-10-CM

## 2013-12-15 DIAGNOSIS — B20 Human immunodeficiency virus [HIV] disease: Secondary | ICD-10-CM

## 2013-12-15 DIAGNOSIS — J441 Chronic obstructive pulmonary disease with (acute) exacerbation: Secondary | ICD-10-CM

## 2013-12-15 DIAGNOSIS — Z5900 Homelessness unspecified: Secondary | ICD-10-CM

## 2013-12-15 DIAGNOSIS — H9190 Unspecified hearing loss, unspecified ear: Secondary | ICD-10-CM

## 2013-12-15 MED ORDER — LEVOFLOXACIN 500 MG PO TABS: 500.0000 mg | ORAL_TABLET | Freq: Every day | ORAL | Status: AC

## 2013-12-15 MED ORDER — BUDESONIDE-FORMOTEROL FUMARATE 160-4.5 MCG/ACT IN AERO: 2.0000 | INHALATION_SPRAY | Freq: Two times a day (BID) | RESPIRATORY_TRACT | Status: DC

## 2013-12-15 MED ORDER — ALBUTEROL SULFATE HFA 108 (90 BASE) MCG/ACT IN AERS: 2.0000 | INHALATION_SPRAY | RESPIRATORY_TRACT | Status: AC | PRN

## 2013-12-15 MED ORDER — BUDESONIDE-FORMOTEROL FUMARATE 160-4.5 MCG/ACT IN AERO
2.0000 | INHALATION_SPRAY | Freq: Two times a day (BID) | RESPIRATORY_TRACT | Status: DC
  Filled 2013-12-15: qty 6

## 2013-12-15 MED ORDER — ENSURE COMPLETE PO LIQD: 237.0000 mL | Freq: Four times a day (QID) | ORAL | Status: AC

## 2013-12-15 MED ORDER — LEVOFLOXACIN 500 MG PO TABS: 500.0000 mg | ORAL_TABLET | Freq: Every day | ORAL | Status: DC

## 2013-12-15 MED ORDER — ALBUTEROL SULFATE HFA 108 (90 BASE) MCG/ACT IN AERS: 2.0000 | INHALATION_SPRAY | RESPIRATORY_TRACT | Status: DC | PRN

## 2013-12-15 MED ORDER — PREDNISONE 10 MG PO TABS: ORAL_TABLET | ORAL | Status: AC

## 2013-12-15 MED ORDER — PREDNISONE 10 MG PO TABS: ORAL_TABLET | ORAL | Status: DC

## 2013-12-15 MED ORDER — BUDESONIDE-FORMOTEROL FUMARATE 160-4.5 MCG/ACT IN AERO: 2.0000 | INHALATION_SPRAY | Freq: Two times a day (BID) | RESPIRATORY_TRACT | Status: AC

## 2013-12-15 NOTE — Progress Notes (Signed)
CARE MANAGEMENT NOTE 12/15/2013  Patient:  Lawrence [name],Lawrence [name]   Account Number:  [id number]  Date Initiated:  12/15/2013  Documentation initiated by:  [facility] Center[facility]AVIS,Ariel Dimitri  Subjective/Objective Assessment:   COPD     Action/Plan:   lives with friend, Lawrence [name]   Anticipated DC Date:  12/15/2013   Anticipated DC Plan:  HOME/SELF CARE      DC Planning Services  CM consult      Choice offered to / List presented to:     DME arranged  [facility]      DME agency  [facility]        Status of service:  Completed, signed off Medicare Important Message given?  NA - LOS <3 / Initial given by admissions (If response is "NO", the following Medicare IM given date fields will be blank) Date Medicare IM given:   Medicare IM given by:   Date Additional Medicare IM given:   Additional Medicare IM given by:    Discharge Disposition:  HOME/SELF CARE  Per UR Regulation:    If discussed at Long Length of Stay Meetings, dates discussed:    Comments:  12/15/2013 1345 Pt states he cannot wait any longer to recieve his portable oxygen tank for home. States his ride was waiting for him. NCM explained importance of having portable oxygen for home. Explained [facility] is waiting for approval from insurance company. Notified [facility] pt will need oxygen delivered to the home. Isidoro DonningAlesia Kyllian Clingerman RN CCM Case Mgmt phone [phone]   12/15/2013 12:50 PM  NCM spoke to pt and states he does not have Rx drug coverage. He had Medicaid when he lived in [place]. [place] [facility] [place]. He is deaf. He does not have a PCP. Arranged appt with Dr. Marthann SchillerMichelle Matthews on 01/19/2014. [facility] rate. Interpreter, [name] was able to go over dc plan with pt for oxygen, medications, and follow up appts. [facility] was not able to provide oxygen for pt. Contacted [facility] for referral and faxed orders, dc summary, qualifying sats and facesheet to [facility]. Waiting arrival of oxygen. Pt  friend, Lawrence [name] will pick him at dc. Pt states he gets an SSI check for $1000 each month. Isidoro DonningAlesia Yulonda Wheeling RN CCM Case Mgmt phone [phone]

## 2013-12-15 NOTE — Discharge Summary (Signed)
PATIENT DETAILS Name: Lawrence [name] Age: 53 y.o. Sex: male Date of Birth: January 01, 1961 MRN: [id number]. Admit Date: 12/13/2013 Admitting Physician: Zannie CovePreetha Joseph, MD PCP:No PCP Per Patient  Recommendations for Outpatient Follow-up:  1. Optimize COPD medications 2. Needs counseling regarding compliance to medications and follow up 3. Stop smoking-now on Home O2  PRIMARY DISCHARGE DIAGNOSIS:  Active Problems:   AIDS   Homeless   Deaf   COPD (chronic obstructive pulmonary disease)   COPD exacerbation      PAST MEDICAL HISTORY: Past Medical History  Diagnosis Date  . AIDS   . COPD (chronic obstructive pulmonary disease)   . Myocardial infarction ~ 2005  . Asthma   . On home oxygen therapy     "3L; 24/7" (12/04/2013)  . Pneumonia     "several times"  . Chronic bronchitis     "get it q yr" (12/04/2013)  . GERD (gastroesophageal reflux disease)   . Stomach ulcer   . Hepatitis B   . Arthritis     "left knee" (12/04/2013)  . Gout   . 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 Deafness     "BORN THAT WAY"    DISCHARGE MEDICATIONS:   Medication List         albuterol 108 (90 BASE) MCG/ACT inhaler  Commonly known as:  PROVENTIL HFA;VENTOLIN HFA  Inhale 2 puffs into the lungs every 4 (four) hours as needed for wheezing or shortness of breath.     budesonide-formoterol 160-4.5 MCG/ACT inhaler  Commonly known as:  SYMBICORT  Inhale 2 puffs into the lungs 2 (two) times daily.     feeding supplement (ENSURE COMPLETE) Liqd  Take 237 mLs by mouth 4 (four) times daily.     levofloxacin 500 MG tablet  Commonly known as:  LEVAQUIN  Take 1 tablet (500 mg total) by mouth daily.     predniSONE 10 MG tablet  Commonly known as:  DELTASONE  - Take 4 tablets (40 mg) daily for 2 days, then,  - Take 3 tablets (30 mg) daily for 2 days, then,  - Take 2 tablets (20 mg) daily for 2 days, then,  - Take 1 tablets (10 mg) daily for 1 days, then stop        ALLERGIES:   Allergies  Allergen Reactions    . Bactrim [Sulfamethoxazole-Tmp Ds]     Fungus around his mouth  . Benadryl [Diphenhydramine Hcl (Sleep)] Itching    BRIEF HPI:  See H&P, Labs, Consult and Test reports for all details in brief, Lawrence [name] is a 53 y.o. male with PMH of HIV/AIDS not on HAART, Deafness, COPD supposed to be on Home O2, [place] in the last 2 months.He used to be on Home O2 for COPD but stopped this.He was evaluated for worsening SOB, cough.   CONSULTATIONS:   None  PERTINENT RADIOLOGIC STUDIES: Dg Chest 2 View (if Patient Has Fever And/or Copd)  12/04/2013   CLINICAL DATA:  Cough and shortness of Breath  EXAM: CHEST  2 VIEW  COMPARISON:  None.  FINDINGS: The heart size and mediastinal contours are within normal limits. Both lungs are clear. The visualized skeletal structures are unremarkable.  IMPRESSION: No active cardiopulmonary disease.   Electronically Signed   By: Alcide CleverMark  Lukens M.D.   On: 12/04/2013 13:07   Nm Myocar Multi W/spect W/wall Motion / Ef  12/06/2013   CLINICAL DATA:  Chest pain  EXAM: MYOCARDIAL IMAGING WITH SPECT (REST AND PHARMACOLOGIC-STRESS)  GATED LEFT VENTRICULAR WALL MOTION STUDY  LEFT VENTRICULAR EJECTION FRACTION  TECHNIQUE: Standard myocardial SPECT imaging was performed after resting intravenous injection of 10 mCi Tc-7057m sestamibi. Subsequently, intravenous infusion of Lexiscan was performed under the supervision of the Cardiology staff. At peak effect of the drug, 30 mCi Tc-8857m sestamibi was injected intravenously and standard myocardial SPECT imaging was performed. Quantitative gated imaging was also performed to evaluate left ventricular wall motion, and estimate left ventricular ejection fraction.  COMPARISON:  None.  FINDINGS: SPECT: Large fixed defect involving the apex and inferior wall. No definite stress-induced ischemia.  Wall motion:  Normal motion  Ejection fraction: 53%. End-diastolic volume 105 cc. End systolic volume 50 cc.  IMPRESSION:  Fixed defect in the inferior wall and apex. No evidence of stress-induced ischemia   Electronically Signed   By: Maryclare BeanArt  Hoss M.D.   On: 12/06/2013 14:11   Dg Chest Port 1 View  12/13/2013   CLINICAL DATA:  pain  EXAM: PORTABLE CHEST - 1 VIEW  COMPARISON:  Prior radiograph from 12/04/2013  FINDINGS: The cardiac and mediastinal silhouettes are stable in size and contour, and remain within normal limits. Pulmonary arteries are mildly prominent, stable from prior.  The lungs are normally inflated. No airspace consolidation, pleural effusion, or pulmonary edema is identified. There is no pneumothorax.  No acute osseous abnormality identified.  IMPRESSION: No acute cardiopulmonary abnormality.   Electronically Signed   By: Rise MuBenjamin  McClintock M.D.   On: 12/13/2013 05:27     PERTINENT LAB RESULTS: CBC:  Recent Labs  12/13/13 0443  WBC 12.5*  HGB 13.7  HCT 43.9  PLT 169   CMET CMP     Component Value Date/Time   NA 146 12/13/2013 0443   K 4.9 12/13/2013 0443   CL 101 12/13/2013 0443   CO2 35* 12/13/2013 0443   GLUCOSE 132* 12/13/2013 0443   BUN 8 12/13/2013 0443   CREATININE 0.78 12/13/2013 0443   CALCIUM 9.2 12/13/2013 0443   GFRNONAA >90 12/13/2013 0443   GFRAA >90 12/13/2013 0443    GFR Estimated Creatinine Clearance: 121.9 ml/min (by C-G formula based on Cr of 0.78). No results found for this basename: LIPASE, AMYLASE,  in the last 72 hours No results found for this basename: CKTOTAL, CKMB, CKMBINDEX, TROPONINI,  in the last 72 hours No components found with this basename: POCBNP,  No results found for this basename: DDIMER,  in the last 72 hours No results found for this basename: HGBA1C,  in the last 72 hours No results found for this basename: CHOL, HDL, LDLCALC, TRIG, CHOLHDL, LDLDIRECT,  in the last 72 hours No results found for this basename: TSH, T4TOTAL, FREET3, T3FREE, THYROIDAB,  in the last 72 hours No results found for this basename: VITAMINB12, FOLATE, FERRITIN, TIBC, IRON, RETICCTPCT,   in the last 72 hours Coags: No results found for this basename: PT, INR,  in the last 72 hours Microbiology: No results found for this or any previous visit (from the past 240 hour(s)).   BRIEF HOSPITAL COURSE:   Active Problems: COPD exacerbation  -admitted and started on IV Solumedrol, nebs and empiric Levaquin, good air entry on exam, very few scattered rhonchi-but significantly improved. Ambulating in hallway without any difficulty.Will discharge on tapering prednisone, and above inhaler regimen. Refused Spiriva, claims he had a bad "reaction" to it.   Acute on Chronic Resp Failure  -apparently previously on Home O2  -qualifies for Home O2, have asked Case Management to arrange. Have counseled patient  to stop smoking while on home O2. He understands risks  HIV/AIDS  -for >20years per pt report, Viral load 277K and CD4 of 230  -not on any meds currently  -has appt at [facility] on 7/29 to establish care   Tobacco Abuse  -counseled extensively-does not have any interest in quitting   Anxiety  -stable   Aortic Stenosis  -moderate per last Echo  -will need outpatient surveillance and monitoring   Congenital Deafness  -interpreter at bedside -translating with this MD yesterday and today  Homeless  -[place]   TODAY-DAY OF DISCHARGE:  Subjective:   Lawrence [name] today has no headache,no chest abdominal pain,no new weakness tingling or numbness, feels much better wants to go home today.   Objective:   Blood pressure 116/83, pulse 91, temperature 98 F (36.7 C), temperature source Oral, resp. rate 20, height 5\' 7"  (1.702 m), weight 95.845 kg (211 lb 4.8 oz), SpO2 94.00%.  Intake/Output Summary (Last 24 hours) at 12/15/13 1114 Last data filed at 12/14/13 1300  Gross per 24 hour  Intake    430 ml  Output      0 ml  Net    430 ml   Filed Weights   12/13/13 2241 12/14/13 0638  Weight: 96.843 kg (213 lb 8 oz) 95.845 kg (211 lb 4.8 oz)     Exam Awake Alert, Oriented *3, No new F.N deficits, Normal affect .AT,PERRAL Supple Neck,No JVD, No cervical lymphadenopathy appriciated.  Symmetrical Chest wall movement, Good air movement bilaterally, CTAB RRR,No Gallops,Rubs or new Murmurs, No Parasternal Heave +ve B.Sounds, Abd Soft, Non tender, No organomegaly appriciated, No rebound -guarding or rigidity. No Cyanosis, Clubbing or edema, No new Rash or bruise  DISCHARGE CONDITION: Stable  DISPOSITION: Home  DISCHARGE INSTRUCTIONS:    Activity:  As tolerated   Diet recommendation: Heart Healthy diet       Discharge Instructions   Call MD for:  difficulty breathing, headache or visual disturbances    Complete by:  As directed      Diet - low sodium heart healthy    Complete by:  As directed      Increase activity slowly    Complete by:  As directed            Follow-up Information   Follow up with MATTHEWS,MICHELLE A., MD.   Specialty:  Internal Medicine   Contact information:   [address] [place] [address] [phone]       Total Time spent on discharge equals 45 minutes.  SignedJeoffrey Massed 12/15/2013 11:14 AM  **Disclaimer: This note may have been dictated with voice recognition software. Similar sounding words can inadvertently be transcribed and this note may contain transcription errors which may not have been corrected upon publication of note.**

## 2013-12-15 NOTE — Progress Notes (Signed)
CARE MANAGEMENT NOTE 12/15/2013  Patient:  Lawrence [name],Lawrence [name]   Account Number:  [id number]  Date Initiated:  12/15/2013  [facility]ation [facility]:  [facility]  Subjective/Objective Assessment:   COPD     Action/Plan:   lives with friend, Lawrence [name]   Anticipated DC Date:  12/15/2013   Anticipated DC Plan:  HOME/SELF CARE      DC Planning Services  CM consult      Choice offered to / List presented to:     DME arranged  [facility]      DME agency  [facility]        Status of service:  Completed, signed off Medicare Important Message given?  NA - LOS <3 / Initial given by admissions (If response is "NO", the following Medicare IM given date fields will be blank) Date Medicare IM given:   Medicare IM given by:   Date Additional Medicare IM given:   Additional Medicare IM given by:    Discharge Disposition:  HOME/SELF CARE  Per UR Regulation:    If discussed at Long Length of Stay Meetings, dates discussed:    Comments:  12/15/2013 1720 NCM contacted [facility] and they are waiting approval from insurance company for oxygen. [employer] insurance company and they state his insurance is only if he resides in [place]. Explained NCM unsure if his move is permanent move to [place]. Insurance company could not provide any information about claim submitted. [facility] will have to contact them. NCM attempted call to pt to get additional info. Unable to leave message.    Isidoro DonningAlesia Kyliana Standen RN CCM Case Mgmt phone [phone]  12/15/2013 1345 Pt states he cannot wait any longer to recieve his portable oxygen tank for home. States his ride was waiting for him. NCM explained importance of having portable oxygen for home. Explained [facility] is waiting for approval from insurance company. Notified [facility] pt will need oxygen delivered to the home. Isidoro DonningAlesia Zamora Colton RN CCM Case Mgmt phone [phone]   12/15/2013 12:50 PM  NCM spoke to pt and states he does not have Rx drug coverage. He had Medicaid when he lived in  [place]. He plans to [place] for [facility] [place]. He is deaf. He does not have a PCP. Arranged appt with Dr. Marthann SchillerMichelle Matthews on 01/19/2014. [facility] at a discount rate. Interpreter, [name] was able to go over dc plan with pt for oxygen, medications, and follow up appts. [facility] was not able to provide oxygen for pt. Contacted [facility] for referral and faxed orders, dc summary, qualifying sats and facesheet to [facility]. Waiting arrival of oxygen. Pt friend, Lawrence [name] will pick him at dc. Pt states he gets an SSI check for $1000 each month. Isidoro DonningAlesia Bonnie Roig RN CCM Case Mgmt phone [phone]

## 2013-12-15 NOTE — Progress Notes (Signed)
NURSING PROGRESS NOTE  Lawrence PicaCurtis [name] [id number] Discharge Data: 12/15/2013 12:10 PM Attending Provider: Maretta BeesShanker M Ghimire, MD PCP:No PCP Per Patient     Lawrence [name] to be D/C'd Home per MD order.  Discussed with the patient the After Visit Summary and all questions fully answered. All IV's discontinued with no bleeding noted. All belongings returned to patient for patient to take home.   Last Vital Signs:  Blood pressure 116/83, pulse 91, temperature 98 F (36.7 C), temperature source Oral, resp. rate 20, height 5\' 7"  (1.702 m), weight 95.845 kg (211 lb 4.8 oz), SpO2 94.00%.  Discharge Medication List   Medication List         albuterol 108 (90 BASE) MCG/ACT inhaler  Commonly known as:  PROVENTIL HFA;VENTOLIN HFA  Inhale 2 puffs into the lungs every 4 (four) hours as needed for wheezing or shortness of breath.     budesonide-formoterol 160-4.5 MCG/ACT inhaler  Commonly known as:  SYMBICORT  Inhale 2 puffs into the lungs 2 (two) times daily.     feeding supplement (ENSURE COMPLETE) Liqd  Take 237 mLs by mouth 4 (four) times daily.     levofloxacin 500 MG tablet  Commonly known as:  LEVAQUIN  Take 1 tablet (500 mg total) by mouth daily.     predniSONE 10 MG tablet  Commonly known as:  DELTASONE  - Take 4 tablets (40 mg) daily for 2 days, then,  - Take 3 tablets (30 mg) daily for 2 days, then,  - Take 2 tablets (20 mg) daily for 2 days, then,  - Take 1 tablets (10 mg) daily for 1 days, then stop

## 2013-12-15 NOTE — Progress Notes (Signed)
VASCULAR LAB    Patient refused test 12/14/13 and 12/15/13.  Please reorder if patient agrees to have test.      Lawrence [name], RVT 12/15/2013, 1:23 PM

## 2013-12-15 NOTE — Progress Notes (Signed)
SATURATION QUALIFICATIONS: (This note is used to comply with regulatory documentation for home oxygen) ? ?Patient Saturations on Room Air at Rest = 90% ? ?Patient Saturations on Room Air while Ambulating = 87% ? ?Patient Saturations on 2 Liters of oxygen while Ambulating = 94% ? ?Please briefly explain why patient needs home oxygen: ?

## 2013-12-15 NOTE — Progress Notes (Signed)
NCM spoke to pt and states he does not have Rx drug coverage. He had Medicaid when he lived in [place]. [facility] [place] [facility] [facility]. He is deaf. He does not have a PCP. Arranged appt with Dr. Marthann SchillerMichelle Matthews on 01/19/2014. [facility] at a discount rate. Interpreter, [name] was able to go over dc plan with pt for oxygen, medications, and follow up appts. [facility] was not able to provide oxygen for pt. Contacted [facility] for referral and faxed orders, dc summary, qualifying sats and facesheet to [facility]. Waiting arrival of oxygen. Pt friend, [name] will pick him at dc. Pt states he gets an SSI check for $1000 each month.  Isidoro DonningAlesia Texanna Hilburn RN CCM Case Mgmt phone [phone]

## 2013-12-17 NOTE — Care Management Note (Signed)
    Page 1 of 2   12/17/2013     2:00:03 PM CARE MANAGEMENT NOTE 12/17/2013  Patient:  Lawrence [name],Lawrence   Account Number:  [id number]  Date Initiated:  12/15/2013  Documentation initiated by:  [facility]  Subjective/Objective Assessment:   COPD     Action/Plan:   lives with friend, [name]   Anticipated DC Date:  12/15/2013   Anticipated DC Plan:  HOME/SELF CARE      DC Planning Services  CM consult      Choice offered to / List presented to:     DME arranged  [facility]      DME agency  [facility]        Status of service:  Completed, signed off Medicare Important Message given?  NA - LOS <3 / Initial given by admissions (If response is "NO", the following Medicare IM given date fields will be blank) Date Medicare IM given:   Medicare IM given by:   Date Additional Medicare IM given:   Additional Medicare IM given by:    Discharge Disposition:  HOME/SELF CARE  Per UR Regulation:    If discussed at Long Length of Stay Meetings, dates discussed:    Comments:  12/16/13 1000 Letha Capeeborah Suzannah Bettes RN, BSN [phone] NCM called [name] with [facility] to check to see if oxygen was set up , [name] states patient was set up with oxygen at home.  12/15/2013 1720 NCM contacted [facility] and they are waiting approval from insurance company for oxygen. [employer] insurance company and they state his insurance is only if he resides in [place]. Explained NCM unsure if his move is permanent move to . Insurance company could not provide any information about claim submitted. [facility] will have to contact them. NCM attempted call to pt to get additional info. Unable to leave message.    [name] RN CCM Case Mgmt phone [phone]  12/15/2013 1345 Pt states he cannot wait any longer to recieve his portable oxygen tank for home. States his ride was waiting for him. NCM explained importance of having portable oxygen for home. Explained [facility] is waiting for approval from insurance company. Notified [facility]  pt will need oxygen delivered to the home. [name] RN CCM Case Mgmt phone [phone]   12/15/2013 12:50 PM  NCM spoke to pt and states he does not have Rx drug coverage. He had Medicaid when he lived in [place]. He plans to apply [place] for [facility]. He is deaf. He does not have a PCP. Arranged appt with Dr. Marthann SchillerMichelle Matthews on 01/19/2014. [facility] to pick up at a discount rate. Interpreter, [name] was able to go over dc plan with pt for oxygen, medications, and follow up appts. [facility] was not able to provide oxygen for pt. Contacted [facility] for referral and faxed orders, dc summary, qualifying sats and facesheet to [facility]. Waiting arrival of oxygen. Pt friend, [name] will pick him at dc. Pt states he gets an SSI check for $1000 each month. [name] RN CCM Case Mgmt phone [phone]

## 2013-12-18 ENCOUNTER — Telehealth: Payer: Self-pay | Admitting: *Deleted

## 2013-12-18 NOTE — Telephone Encounter (Signed)
Patient confirmed upcoming appointment and the need for an interpreter.  RN gave patient directions to the office. Lawrence [name], Lawrence Duthie M, RN

## 2013-12-19 LAB — HIV-1 GENOTYPR PLUS

## 2013-12-23 ENCOUNTER — Ambulatory Visit: Payer: Medicare Other

## 2014-01-01 ENCOUNTER — Ambulatory Visit: Payer: Medicare Other

## 2014-01-19 ENCOUNTER — Ambulatory Visit: Payer: Medicare (Managed Care) | Admitting: Family Medicine

## 2015-05-10 IMAGING — CR DG CHEST 1V PORT
1 series · 1 of 1 positions shown · non-contrast
Comparison: Prior radiograph from 12/04/2013

CLINICAL DATA: pain

EXAM:
PORTABLE CHEST - 1 VIEW

[AP]
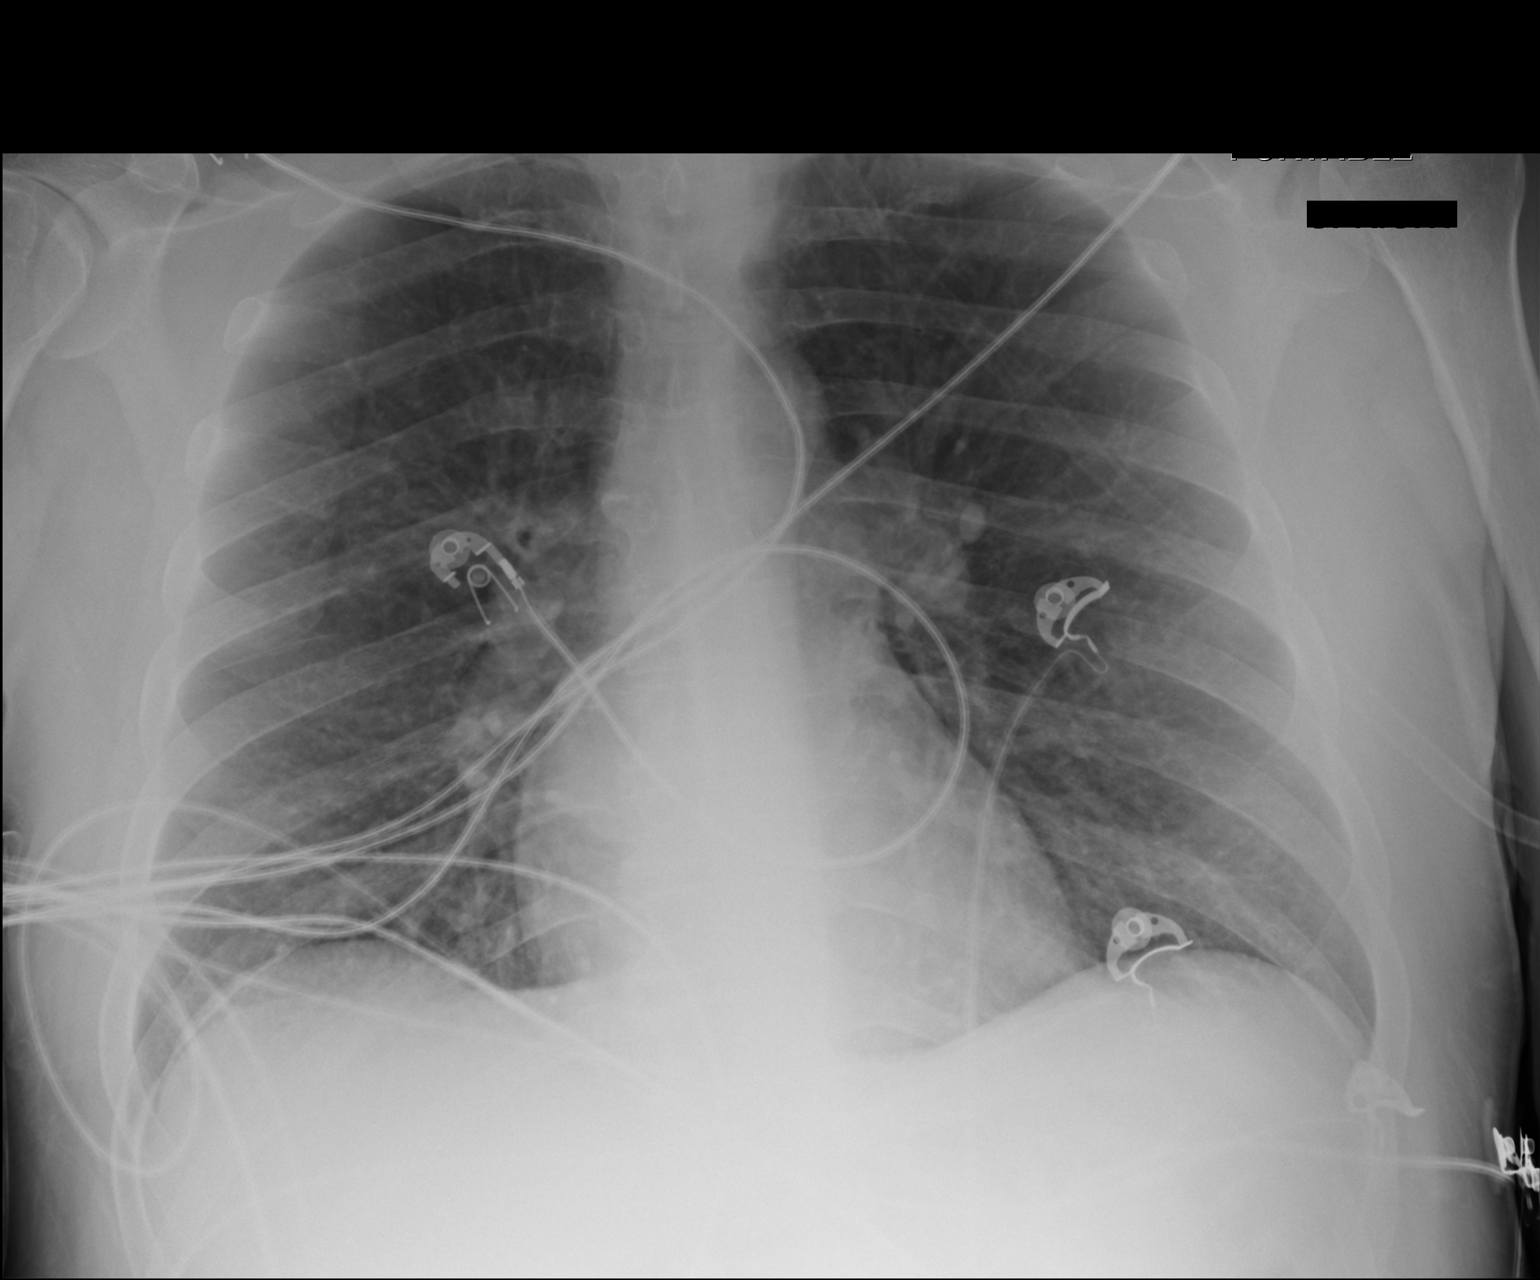

[1 of 1 positions shown; findings below may reference images not displayed]

FINDINGS: The cardiac and mediastinal silhouettes are stable in size and
contour, and remain within normal limits. Pulmonary arteries are
mildly prominent, stable from prior.

The lungs are normally inflated. No airspace consolidation, pleural
effusion, or pulmonary edema is identified. There is no
pneumothorax.

No acute osseous abnormality identified.
IMPRESSION: No acute cardiopulmonary abnormality.
